# Patient Record
Sex: Female | Born: 1955 | Race: White | Hispanic: No | Smoking: Former smoker
Health system: Southern US, Community
[De-identification: ages and names within clinical notes are randomized; demographics above are authoritative.]

## PROBLEM LIST (undated history)

## (undated) DIAGNOSIS — I10 Essential (primary) hypertension: Secondary | ICD-10-CM

## (undated) DIAGNOSIS — T7840XA Allergy, unspecified, initial encounter: Secondary | ICD-10-CM

## (undated) DIAGNOSIS — F32A Depression, unspecified: Secondary | ICD-10-CM

## (undated) DIAGNOSIS — C439 Malignant melanoma of skin, unspecified: Secondary | ICD-10-CM

## (undated) HISTORY — PX: CHOLECYSTECTOMY: SHX55

## (undated) HISTORY — PX: HYSTEROTOMY: SHX1776

## (undated) HISTORY — PX: LAPAROSCOPIC GASTRIC SLEEVE RESECTION: SHX5895

## (undated) HISTORY — PX: CARPAL TUNNEL RELEASE: SHX101

## (undated) HISTORY — PX: MENISCUS REPAIR: SHX5179

---

## 2020-03-19 ENCOUNTER — Ambulatory Visit (HOSPITAL_COMMUNITY)
Admission: EM | Admit: 2020-03-19 | Discharge: 2020-03-19 | Disposition: A | Discharge status: Home / Self Care | Payer: Medicare Other | Attending: Emergency Medicine | Admitting: Emergency Medicine

## 2020-03-19 ENCOUNTER — Emergency Department (HOSPITAL_COMMUNITY): Payer: Medicare Other | Admitting: Anesthesiology

## 2020-03-19 ENCOUNTER — Encounter (HOSPITAL_COMMUNITY)
Admission: EM | Disposition: A | Discharge status: Home / Self Care | Payer: Self-pay | Source: Home / Self Care | Attending: Emergency Medicine

## 2020-03-19 ENCOUNTER — Other Ambulatory Visit: Payer: Self-pay

## 2020-03-19 ENCOUNTER — Encounter (HOSPITAL_COMMUNITY): Payer: Self-pay

## 2020-03-19 ENCOUNTER — Emergency Department (HOSPITAL_COMMUNITY): Payer: Medicare Other

## 2020-03-19 DIAGNOSIS — Z23 Encounter for immunization: Secondary | ICD-10-CM | POA: Insufficient documentation

## 2020-03-19 DIAGNOSIS — Z85820 Personal history of malignant melanoma of skin: Secondary | ICD-10-CM | POA: Diagnosis not present

## 2020-03-19 DIAGNOSIS — W19XXXA Unspecified fall, initial encounter: Secondary | ICD-10-CM | POA: Insufficient documentation

## 2020-03-19 DIAGNOSIS — S0181XA Laceration without foreign body of other part of head, initial encounter: Secondary | ICD-10-CM | POA: Insufficient documentation

## 2020-03-19 DIAGNOSIS — S0990XA Unspecified injury of head, initial encounter: Secondary | ICD-10-CM

## 2020-03-19 DIAGNOSIS — Y93K1 Activity, walking an animal: Secondary | ICD-10-CM | POA: Insufficient documentation

## 2020-03-19 DIAGNOSIS — Z87891 Personal history of nicotine dependence: Secondary | ICD-10-CM | POA: Diagnosis not present

## 2020-03-19 DIAGNOSIS — Z20822 Contact with and (suspected) exposure to covid-19: Secondary | ICD-10-CM | POA: Diagnosis not present

## 2020-03-19 HISTORY — DX: Allergy, unspecified, initial encounter: T78.40XA

## 2020-03-19 HISTORY — DX: Malignant melanoma of skin, unspecified: C43.9

## 2020-03-19 HISTORY — PX: LACERATION REPAIR: SHX5284

## 2020-03-19 HISTORY — DX: Essential (primary) hypertension: I10

## 2020-03-19 HISTORY — DX: Depression, unspecified: F32.A

## 2020-03-19 LAB — RESP PANEL BY RT-PCR (FLU A&B, COVID) ARPGX2
Influenza A by PCR: NEGATIVE
Influenza B by PCR: NEGATIVE
SARS Coronavirus 2 by RT PCR: NEGATIVE

## 2020-03-19 SURGERY — REPAIR, LACERATION, 2 OR MORE
Anesthesia: General | Site: Face

## 2020-03-19 MED ORDER — FENTANYL CITRATE (PF) 100 MCG/2ML IJ SOLN
25.0000 ug | INTRAMUSCULAR | Status: DC | PRN
  Administered 2020-03-19 (×2): 50 ug via INTRAVENOUS

## 2020-03-19 MED ORDER — PROPOFOL 10 MG/ML IV BOLUS
INTRAVENOUS | Status: AC
  Filled 2020-03-19: qty 20

## 2020-03-19 MED ORDER — PROPOFOL 10 MG/ML IV BOLUS
INTRAVENOUS | Status: DC | PRN
  Administered 2020-03-19: 180 mg via INTRAVENOUS

## 2020-03-19 MED ORDER — ACETAMINOPHEN 10 MG/ML IV SOLN: 1000.0000 mg | Freq: Once | INTRAVENOUS | Status: DC

## 2020-03-19 MED ORDER — DEXAMETHASONE SODIUM PHOSPHATE 10 MG/ML IJ SOLN
INTRAMUSCULAR | Status: DC | PRN
  Administered 2020-03-19: 10 mg via INTRAVENOUS

## 2020-03-19 MED ORDER — ACETAMINOPHEN 10 MG/ML IV SOLN
INTRAVENOUS | Status: AC
  Filled 2020-03-19: qty 100

## 2020-03-19 MED ORDER — TETANUS-DIPHTH-ACELL PERTUSSIS 5-2.5-18.5 LF-MCG/0.5 IM SUSY
0.5000 mL | PREFILLED_SYRINGE | Freq: Once | INTRAMUSCULAR | Status: AC
  Administered 2020-03-19: 0.5 mL via INTRAMUSCULAR
  Filled 2020-03-19: qty 0.5

## 2020-03-19 MED ORDER — ONDANSETRON HCL 4 MG/2ML IJ SOLN
INTRAMUSCULAR | Status: DC | PRN
  Administered 2020-03-19: 4 mg via INTRAVENOUS

## 2020-03-19 MED ORDER — KETOROLAC TROMETHAMINE 15 MG/ML IJ SOLN
15.0000 mg | Freq: Once | INTRAMUSCULAR | Status: AC
  Administered 2020-03-19: 15 mg via INTRAVENOUS

## 2020-03-19 MED ORDER — PHENYLEPHRINE HCL (PRESSORS) 10 MG/ML IV SOLN
INTRAVENOUS | Status: DC | PRN
  Administered 2020-03-19: 40 ug via INTRAVENOUS

## 2020-03-19 MED ORDER — FENTANYL CITRATE (PF) 100 MCG/2ML IJ SOLN
INTRAMUSCULAR | Status: AC
  Filled 2020-03-19: qty 2

## 2020-03-19 MED ORDER — LACTATED RINGERS IV SOLN: INTRAVENOUS | Status: DC | PRN

## 2020-03-19 MED ORDER — MIDAZOLAM HCL 5 MG/5ML IJ SOLN
INTRAMUSCULAR | Status: DC | PRN
  Administered 2020-03-19: 2 mg via INTRAVENOUS

## 2020-03-19 MED ORDER — SUCCINYLCHOLINE CHLORIDE 20 MG/ML IJ SOLN
INTRAMUSCULAR | Status: DC | PRN
  Administered 2020-03-19: 120 mg via INTRAVENOUS

## 2020-03-19 MED ORDER — BUPIVACAINE HCL (PF) 0.25 % IJ SOLN
INTRAMUSCULAR | Status: AC
  Filled 2020-03-19: qty 30

## 2020-03-19 MED ORDER — 0.9 % SODIUM CHLORIDE (POUR BTL) OPTIME
TOPICAL | Status: DC | PRN
  Administered 2020-03-19: 1000 mL

## 2020-03-19 MED ORDER — MORPHINE SULFATE (PF) 4 MG/ML IV SOLN
4.0000 mg | Freq: Once | INTRAVENOUS | Status: AC
  Administered 2020-03-19: 4 mg via INTRAVENOUS
  Filled 2020-03-19: qty 1

## 2020-03-19 MED ORDER — ARTIFICIAL TEARS OPHTHALMIC OINT
TOPICAL_OINTMENT | OPHTHALMIC | Status: DC | PRN
  Administered 2020-03-19: 1 via OPHTHALMIC

## 2020-03-19 MED ORDER — OXYCODONE HCL 5 MG PO TABS
ORAL_TABLET | ORAL | Status: AC
  Filled 2020-03-19: qty 1

## 2020-03-19 MED ORDER — OXYCODONE HCL 5 MG PO TABS
5.0000 mg | ORAL_TABLET | Freq: Once | ORAL | Status: AC
  Administered 2020-03-19: 5 mg via ORAL

## 2020-03-19 MED ORDER — OXYCODONE HCL 5 MG PO TABS: 5.0000 mg | ORAL_TABLET | ORAL | 0 refills | Status: AC | PRN

## 2020-03-19 MED ORDER — ONDANSETRON HCL 4 MG/2ML IJ SOLN: 4.0000 mg | Freq: Once | INTRAMUSCULAR | Status: DC | PRN

## 2020-03-19 MED ORDER — FENTANYL CITRATE (PF) 100 MCG/2ML IJ SOLN
INTRAMUSCULAR | Status: DC | PRN
  Administered 2020-03-19: 25 ug via INTRAVENOUS
  Administered 2020-03-19: 100 ug via INTRAVENOUS
  Administered 2020-03-19: 50 ug via INTRAVENOUS

## 2020-03-19 MED ORDER — DOXYCYCLINE HYCLATE 50 MG PO CAPS: 50.0000 mg | ORAL_CAPSULE | Freq: Two times a day (BID) | ORAL | 0 refills | Status: AC

## 2020-03-19 MED ORDER — BACITRACIN ZINC 500 UNIT/GM EX OINT
TOPICAL_OINTMENT | CUTANEOUS | Status: AC
  Filled 2020-03-19: qty 28.35

## 2020-03-19 MED ORDER — BUPIVACAINE HCL (PF) 0.25 % IJ SOLN
INTRAMUSCULAR | Status: DC | PRN
  Administered 2020-03-19: 5 mL

## 2020-03-19 MED ORDER — KETOROLAC TROMETHAMINE 15 MG/ML IJ SOLN
INTRAMUSCULAR | Status: AC
  Filled 2020-03-19: qty 1

## 2020-03-19 MED ORDER — BACITRACIN ZINC 500 UNIT/GM EX OINT
TOPICAL_OINTMENT | CUTANEOUS | Status: DC | PRN
  Administered 2020-03-19: 1 via TOPICAL

## 2020-03-19 MED ORDER — LIDOCAINE 2% (20 MG/ML) 5 ML SYRINGE
INTRAMUSCULAR | Status: DC | PRN
  Administered 2020-03-19: 100 mg via INTRAVENOUS

## 2020-03-19 MED ORDER — FENTANYL CITRATE (PF) 250 MCG/5ML IJ SOLN
INTRAMUSCULAR | Status: AC
  Filled 2020-03-19: qty 5

## 2020-03-19 MED ORDER — CEFAZOLIN SODIUM-DEXTROSE 2-4 GM/100ML-% IV SOLN
2.0000 g | INTRAVENOUS | Status: AC
  Administered 2020-03-19: 2 g via INTRAVENOUS
  Filled 2020-03-19 (×2): qty 100

## 2020-03-19 MED ORDER — MIDAZOLAM HCL 2 MG/2ML IJ SOLN
INTRAMUSCULAR | Status: AC
  Filled 2020-03-19: qty 2

## 2020-03-19 SURGICAL SUPPLY — 48 items
BAG DECANTER FOR FLEXI CONT (MISCELLANEOUS) IMPLANT
BLADE CLIPPER SURG (BLADE) IMPLANT
BNDG GAUZE ELAST 4 BULKY (GAUZE/BANDAGES/DRESSINGS) IMPLANT
CANISTER SUCT 3000ML PPV (MISCELLANEOUS) ×3 IMPLANT
CHLORAPREP W/TINT 26 (MISCELLANEOUS) IMPLANT
CNTNR URN SCR LID CUP LEK RST (MISCELLANEOUS) IMPLANT
CONT SPEC 4OZ STRL OR WHT (MISCELLANEOUS)
COVER SURGICAL LIGHT HANDLE (MISCELLANEOUS) ×3 IMPLANT
COVER WAND RF STERILE (DRAPES) ×3 IMPLANT
DRAPE HALF SHEET 40X57 (DRAPES) IMPLANT
DRAPE IMP U-DRAPE 54X76 (DRAPES) ×3 IMPLANT
DRAPE INCISE IOBAN 66X45 STRL (DRAPES) IMPLANT
DRAPE LAPAROTOMY 100X72 PEDS (DRAPES) IMPLANT
DRSG ADAPTIC 3X8 NADH LF (GAUZE/BANDAGES/DRESSINGS) IMPLANT
DRSG PAD ABDOMINAL 8X10 ST (GAUZE/BANDAGES/DRESSINGS) IMPLANT
DRSG VAC ATS LRG SENSATRAC (GAUZE/BANDAGES/DRESSINGS) IMPLANT
DRSG VAC ATS MED SENSATRAC (GAUZE/BANDAGES/DRESSINGS) IMPLANT
DRSG VAC ATS SM SENSATRAC (GAUZE/BANDAGES/DRESSINGS) IMPLANT
ELECT CAUTERY BLADE 6.4 (BLADE) ×3 IMPLANT
ELECT COATED BLADE 2.86 ST (ELECTRODE) ×3 IMPLANT
ELECT NEEDLE BLADE 2-5/6 (NEEDLE) ×3 IMPLANT
ELECT REM PT RETURN 9FT ADLT (ELECTROSURGICAL) ×3
ELECTRODE REM PT RTRN 9FT ADLT (ELECTROSURGICAL) ×1 IMPLANT
GAUZE 4X4 16PLY RFD (DISPOSABLE) ×3 IMPLANT
GAUZE SPONGE 4X4 12PLY STRL (GAUZE/BANDAGES/DRESSINGS) IMPLANT
GAUZE SPONGE 4X4 12PLY STRL LF (GAUZE/BANDAGES/DRESSINGS) ×3 IMPLANT
GLOVE BIO SURGEON STRL SZ 6 (GLOVE) ×3 IMPLANT
GLOVE SURG SS PI 6.0 STRL IVOR (GLOVE) ×3 IMPLANT
GOWN STRL REUS W/ TWL LRG LVL3 (GOWN DISPOSABLE) ×2 IMPLANT
GOWN STRL REUS W/TWL LRG LVL3 (GOWN DISPOSABLE) ×4
HANDPIECE INTERPULSE COAX TIP (DISPOSABLE)
KIT BASIN OR (CUSTOM PROCEDURE TRAY) ×3 IMPLANT
KIT TURNOVER KIT B (KITS) ×3 IMPLANT
NS IRRIG 1000ML POUR BTL (IV SOLUTION) ×3 IMPLANT
PACK GENERAL/GYN (CUSTOM PROCEDURE TRAY) ×3 IMPLANT
PACK UNIVERSAL I (CUSTOM PROCEDURE TRAY) IMPLANT
PAD ARMBOARD 7.5X6 YLW CONV (MISCELLANEOUS) ×6 IMPLANT
SET HNDPC FAN SPRY TIP SCT (DISPOSABLE) IMPLANT
SURGILUBE 2OZ TUBE FLIPTOP (MISCELLANEOUS) IMPLANT
SUT MNCRL AB 4-0 PS2 18 (SUTURE) ×9 IMPLANT
SUT PLAIN GUT FAST 5-0 (SUTURE) ×3 IMPLANT
SUT PROLENE 5 0 P 3 (SUTURE) ×6 IMPLANT
SWAB COLLECTION DEVICE MRSA (MISCELLANEOUS) IMPLANT
SWAB CULTURE ESWAB REG 1ML (MISCELLANEOUS) IMPLANT
TAPE PAPER 2X10 WHT MICROPORE (GAUZE/BANDAGES/DRESSINGS) ×3 IMPLANT
TOWEL GREEN STERILE (TOWEL DISPOSABLE) ×3 IMPLANT
TOWEL GREEN STERILE FF (TOWEL DISPOSABLE) ×3 IMPLANT
UNDERPAD 30X36 HEAVY ABSORB (UNDERPADS AND DIAPERS) ×3 IMPLANT

## 2020-03-19 NOTE — ED Provider Notes (Signed)
[facility]PART[facility] Provider Note   CSN: [id number] Arrival date & time: 03/19/20  1246     History Chief Complaint  Patient presents with  . Head Laceration  . Fall    Judy Richards is a 65 y.o. female.  The history is provided by the patient and medical records. No language interpreter was used.  Facial Injury Mechanism of injury:  Fall Location:  Forehead and nose Time since incident:  20 minutes Pain details:    Quality:  Dull   Severity:  Moderate   Timing:  Constant   Progression:  Unchanged Foreign body present:  Unable to specify Relieved by:  Nothing Worsened by:  Nothing Ineffective treatments:  None tried Associated symptoms: headaches   Associated symptoms: no altered mental status, no congestion, no difficulty breathing, no double vision, no loss of consciousness, no malocclusion, no nausea, no neck pain, no rhinorrhea, no trismus, no vomiting and no wheezing        Past Medical History:  Diagnosis Date  . Allergies    Requires shots and multiple medications for allergies  . Depression   . Hypertension   . Melanoma ([place])    L arm    There are no problems to display for this patient.      OB History   No obstetric history on file.     No family history on file.  Social History   Tobacco Use  . Smoking status: Former Smoker    Quit date: 2017    Years since quitting: 5.0  . Smokeless tobacco: Never Used  Vaping Use  . Vaping Use: Former  Substance Use Topics  . Alcohol use: Not Currently  . Drug use: Never    Home Medications Prior to Admission medications   Not on File    Allergies    Lisinopril and Lyrica [pregabalin]  Review of Systems   Review of Systems  Constitutional: Negative for chills, fatigue and fever.  HENT: Negative for congestion and rhinorrhea.   Eyes: Negative for double vision, photophobia, pain, redness and visual disturbance.  Respiratory: Negative for chest tightness,  shortness of breath and wheezing.   Gastrointestinal: Negative for constipation, diarrhea, nausea and vomiting.  Genitourinary: Negative for flank pain.  Musculoskeletal: Negative for back pain and neck pain.  Neurological: Positive for headaches. Negative for dizziness and loss of consciousness.  Psychiatric/Behavioral: Negative for agitation.  All other systems reviewed and are negative.   Physical Exam Updated Vital Signs BP (!) 158/80   Pulse 78   Resp (!) 22   Ht 5\' 5"  (1.651 m)   Wt 101.6 kg   SpO2 97%   BMI 37.28 kg/m   Physical Exam Vitals and nursing note reviewed.  Constitutional:      General: She is not in acute distress.    Appearance: She is well-developed and well-nourished. She is not ill-appearing, toxic-appearing or diaphoretic.  HENT:     Head: Laceration present.      Comments: Pupils are symmetric and with normal extraocular movements.  She is able to raise both eyebrows.  No crepitance.    Nose: No congestion or rhinorrhea.     Mouth/Throat:     Mouth: Mucous membranes are moist.     Pharynx: No oropharyngeal exudate or posterior oropharyngeal erythema.  Eyes:     Extraocular Movements: Extraocular movements intact.     Conjunctiva/sclera: Conjunctivae normal.     Pupils: Pupils are equal, round, and reactive to light.  Cardiovascular:     Rate and Rhythm: Normal rate and regular rhythm.     Heart sounds: No murmur heard.   Pulmonary:     Effort: Pulmonary effort is normal. No respiratory distress.     Breath sounds: Normal breath sounds. No wheezing, rhonchi or rales.  Chest:     Chest wall: No tenderness.  Abdominal:     General: Abdomen is flat.     Palpations: Abdomen is soft.     Tenderness: There is no abdominal tenderness. There is no right CVA tenderness, guarding or rebound.  Musculoskeletal:        General: Tenderness and signs of injury present. No edema.     Cervical back: Neck supple. No tenderness.  Skin:    General: Skin is  warm and dry.     Capillary Refill: Capillary refill takes less than 2 seconds.     Findings: No erythema.  Neurological:     General: No focal deficit present.     Mental Status: She is alert.     Sensory: No sensory deficit.     Motor: No weakness.  Psychiatric:        Mood and Affect: Mood and affect and mood normal.         ED Results / Procedures / Treatments   Labs (all labs ordered are listed, but only abnormal results are displayed) Labs Reviewed  RESP PANEL BY RT-PCR (FLU A&B, COVID) [id number]    EKG EKG Interpretation  Date/Time:  Sunday March 19 2020 13:13:25 EST Ventricular Rate:  80 PR Interval:    QRS Duration: 102 QT Interval:  427 QTC Calculation: 493 R Axis:   -20 Text Interpretation: Sinus rhythm Borderline left axis deviation Borderline prolonged QT interval No prior ECG for comparison. No STEMI Confirmed by Antony Blackbird [phone]) on 03/19/2020 2:05:35 PM   Radiology CT Head Wo Contrast  Result Date: 03/19/2020 CLINICAL DATA:  Head trauma EXAM: CT HEAD WITHOUT CONTRAST TECHNIQUE: Contiguous axial images were obtained from the base of the skull through the vertex without intravenous contrast. COMPARISON:  None. FINDINGS: Brain: Ventricles are normal in size and configuration. There is no mass, hemorrhage, edema or other evidence of acute parenchymal abnormality. No extra-axial hemorrhage. Vascular: Chronic calcified atherosclerotic changes of the large vessels at the skull base. No unexpected hyperdense vessel. Skull: No skull fracture or displacement. Sinuses/Orbits: Orbital and retro-orbital soft tissues are unremarkable. Other: Soft tissue defect/laceration overlying the frontal bones. No underlying fracture. IMPRESSION: 1. Soft tissue defect/laceration overlying the frontal bones. No underlying fracture. 2. No acute intracranial abnormality. No intracranial mass, hemorrhage or edema. Electronically Signed   By: Franki Cabot M.D.   On: 03/19/2020 14:31   CT  Cervical Spine Wo Contrast  Result Date: 03/19/2020 CLINICAL DATA:  Neck trauma EXAM: CT CERVICAL SPINE WITHOUT CONTRAST TECHNIQUE: Multidetector CT imaging of the cervical spine was performed without intravenous contrast. Multiplanar CT image reconstructions were also generated. COMPARISON:  None. FINDINGS: Alignment: Mild scoliosis. No evidence of acute vertebral body subluxation. Skull base and vertebrae: No fracture line or displaced fracture fragment is seen. Soft tissues and spinal canal: No prevertebral fluid or swelling. No visible canal hematoma. Disc levels: Degenerative changes involving the disc spaces from C3-4 through C7-T1, mild to moderate in degree with associated disc space narrowings and osseous spurring. Associated disc-osteophytic bulges at the C3-4 and C4-5 levels causing moderate central canal stenoses. Additional degenerative hypertrophy of the uncovertebral and facet joints causing moderate to severe LEFT  neural foramen stenoses at the C3-4 and C4-5 levels with possible associated nerve root impingement. Upper chest: No acute findings. Mild scarring/fibrosis in the lung apices. Other: Bilateral carotid atherosclerosis. IMPRESSION: 1. No acute findings. No fracture or acute subluxation within the cervical spine. 2. Degenerative changes of the cervical spine, as detailed above. 3. Bilateral carotid atherosclerosis. Electronically Signed   By: Franki Cabot M.D.   On: 03/19/2020 14:35   CT Maxillofacial Wo Contrast  Result Date: 03/19/2020 CLINICAL DATA:  Facial trauma EXAM: CT MAXILLOFACIAL WITHOUT CONTRAST TECHNIQUE: Multidetector CT imaging of the maxillofacial structures was performed. Multiplanar CT image reconstructions were also generated. COMPARISON:  None. FINDINGS: Osseous: Lower frontal bones are intact and normally aligned. No displaced nasal bone fracture. Osseous structures about the orbits appear intact and normally aligned bilaterally. Walls of the maxillary sinuses appear  intact and normally aligned. Bilateral zygomatic arches and pterygoid plates are intact. No mandible fracture or displacement seen. Orbits: Orbital globes appear normal in position and configuration. No retro-orbital hemorrhage or edema. Sinuses: Mucosal thickening/fluid within the LEFT maxillary sinus, ethmoid air cells and RIGHT sphenoid sinus, of uncertain chronicity. Soft tissues: Prominent soft tissue defect/laceration overlying the lower frontal bones. No underlying fracture. Limited intracranial: No significant or unexpected finding. IMPRESSION: 1. Prominent soft tissue defect/laceration overlying the lower frontal bones. No underlying fracture. 2. No facial bone fracture or dislocation seen. 3. Fairly extensive paranasal sinus disease, of uncertain chronicity. Electronically Signed   By: Franki Cabot M.D.   On: 03/19/2020 14:27    Procedures Procedures (including critical care time)  Medications Ordered in ED Medications  ceFAZolin (ANCEF) IVPB 2g/100 mL premix (has no administration in time range)  Tdap (BOOSTRIX) injection 0.5 mL (0.5 mLs Intramuscular Given 03/19/20 1426)  morphine 4 MG/ML injection 4 mg (4 mg Intravenous Given 03/19/20 1426)  morphine 4 MG/ML injection 4 mg (4 mg Intravenous Given 03/19/20 1543)    ED Course  I have reviewed the triage vital signs and the nursing notes.  Pertinent labs & imaging results that were available during my care of the patient were reviewed by me and considered in my medical decision making (see chart for details).    MDM Rules/Calculators/A&P                          Mykenna Service is a 65 y.o. female with a past medical history significant for hypertension, depression, and prior cholecystectomy who presents with head injury after a fall.  Patient reports that she was walking her dog and had to use the restroom and urinate and while trying to get to the bathroom, she had a mechanical fall tripping.  She reports she hit her forehead against a  wooden square pole.  She did not lose consciousness but had sudden onset of headache.  She also reports there was a "large chunk of my forehead" on the wooden pole.  She had a significant of bleeding and EMS was called.  EMS put a pressure dressing in place and she came to the emergency department in a c-collar.  On arrival, airway is intact and breath sounds are equal bilaterally.  She is complaining of only some headache and facial pain.  She denies nausea, vomiting, vision changes, diplopia, speech difficulties, or facial droop.  She is denying any other complaints in her neck, back, chest, or abdomen.  On exam, lungs are clear and chest is nontender.  Abdomen is nontender.  No tenderness in  the back.  No tenderness in the neck but she is in a c-collar.  There is still some bleeding under the dressing, it was removed and she has an extremely large complex laceration to her forehead that extends towards both her eyebrows and down her nose.  There is also a large piece of tissue missing.   CTs were obtained of the head, neck, and face and there was no evidence of acute fracture or intracranial hemorrhage.  No cervical spine injury seen but there was some degenerative changes we discussed.  Collar was removed.  Due to the extent of the facial trauma, facial trauma team was called.  They will come see the patient to decide where would be the most appropriate place for repair.  Tdap was updated as she did not know her last tetanus vaccination.  Otolaryngology came to see the patient and they will take her to the OR for management.  They ordered a COVID test and she will go to the OR for facial repair   Final Clinical Impression(s) / ED Diagnoses Final diagnoses:  Injury of head, initial encounter  Fall, initial encounter  Laceration of forehead, initial encounter    Clinical Impression: 1. Injury of head, initial encounter   2. Fall, initial encounter   3. Laceration of forehead, initial  encounter     Disposition: To OR with facial surgery  This note was prepared with assistance of Dragon voice recognition software. Occasional wrong-word or sound-a-like substitutions may have occurred due to the inherent limitations of voice recognition software.     Edenilson Austad, Gwenyth Allegra, MD 03/19/20 [id number]

## 2020-03-19 NOTE — Progress Notes (Addendum)
Pacu Discharge Note  Patient instuctions were given to family. Wound care, diet, pain, follow up care and how and whom to contact with concerns were discussed. Family aware someone needs to remain with patient overnight and concerns after receiving anesthesia and what to avoid and safety. Answered all questions and concerns.   Discharge paperwork has clear contact informations for surgeon and 24 hour RN line for concerns.  Discussed with pt and Son things to look for in head trauma that should be called into Surgeon or reasons to return to ED.   Discussed what concerns to look for including infection and signs/symptoms to look for.   IV was removed prior to discharge. Patient was brought to car with belongings.   Pt exits my care.

## 2020-03-19 NOTE — ED Triage Notes (Signed)
Pt arrived to ED via EMS w/ c/o head lac to central R side of forehead. EMS reports lac is like a star pattern w/ skull showing, but appears intact. Pt had mechanical fall while walking her dog down a steep hill, when she tripped and landed head first w/ impact to a wooden post w/ sharp edge. Pt A&Ox4, no LOC.  EMS IV 20g R hand, 276mcg fentanyl given over 40 min period of time  VS: BP 150/80, HR 90

## 2020-03-19 NOTE — Anesthesia Procedure Notes (Signed)
Procedure Name: Intubation Date/Time: 03/19/2020 6:57 PM Performed by: Suzy Bouchard, CRNA Pre-anesthesia Checklist: Patient identified, Emergency Drugs available, Suction available, Patient being monitored and Timeout performed Patient Re-evaluated:Patient Re-evaluated prior to induction Oxygen Delivery Method: Circle system utilized Preoxygenation: Pre-oxygenation with 100% oxygen Induction Type: IV induction and Rapid sequence Laryngoscope Size: Glidescope and 3 Grade View: Grade I Tube type: Oral Tube size: 7.0 mm Number of attempts: 1 Airway Equipment and Method: Video-laryngoscopy and Stylet Placement Confirmation: ETT inserted through vocal cords under direct vision,  positive ETCO2 and breath sounds checked- equal and bilateral Secured at: 23 cm Tube secured with: Tape Dental Injury: Teeth and Oropharynx as per pre-operative assessment  Comments: Elective glidescope- patient with "stiff neck" after fall

## 2020-03-19 NOTE — Anesthesia Preprocedure Evaluation (Addendum)
Anesthesia Evaluation  Patient identified by MRN, date of birth, ID band Patient awake  General Assessment Comment:facial laceration s/p fall while walking dog into wooden post  Reviewed: Allergy & Precautions, NPO status , Patient's Chart, lab work & pertinent test results  Airway Mallampati: II  TM Distance: >3 FB Neck ROM: Full    Dental  (+) Teeth Intact, Dental Advisory Given, Caps   Pulmonary shortness of breath, former smoker,    Pulmonary exam normal breath sounds clear to auscultation       Cardiovascular hypertension, Pt. 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  Reproductive/Obstetrics                            Anesthesia Physical Anesthesia Plan  ASA: II  Anesthesia Plan: General   Post-op Pain Management:    Induction: Intravenous, Rapid sequence and Cricoid pressure planned  PONV Risk Score and Plan: 3 and Ondansetron, Dexamethasone and Midazolam  Airway Management Planned: Oral ETT  Additional Equipment:   Intra-op Plan:   Post-operative Plan: Extubation in OR  Informed Consent: I have reviewed the patients History and Physical, chart, labs and discussed the procedure including the risks, benefits and alternatives for the proposed anesthesia with the patient or authorized representative who has indicated his/her understanding and acceptance.     Dental advisory given  Plan Discussed with: CRNA  Anesthesia Plan  Comments:         Anesthesia Quick Evaluation

## 2020-03-19 NOTE — Anesthesia Postprocedure Evaluation (Signed)
Anesthesia Post Note  Patient: Judy Richards  Procedure(s) Performed: REPAIR MULTIPLE LACERATIONS (N/A Face)     Patient location during evaluation: PACU Anesthesia Type: General Level of consciousness: awake and alert Pain management: pain level controlled Vital Signs Assessment: post-procedure vital signs reviewed and stable Respiratory status: spontaneous breathing, nonlabored ventilation, respiratory function stable and patient connected to nasal cannula oxygen Cardiovascular status: blood pressure returned to baseline and stable Postop Assessment: no apparent nausea or vomiting Anesthetic complications: no   No complications documented.  Last Vitals:  Vitals:   03/19/20 2015 03/19/20 2120  BP:  (!) (P) 149/73  Pulse: 92   Resp: 17   Temp:  (P) 36.8 C  SpO2: 98%     Last Pain:  Vitals:   03/19/20 2120  PainSc: (P) 6                  Tiajuana Amass

## 2020-03-19 NOTE — Discharge Instructions (Signed)
Laceration Care, Adult A laceration is a cut that may go through all layers of the skin. The cut may also go into the tissue that is right under the skin. Some cuts heal on their own. Others need to be closed with stitches (sutures), staples, skin adhesive strips, or skin glue. Taking care of your injury lowers your risk of infection, helps your injury to heal better, and may prevent scarring. Supplies needed:  Soap.  Water.  Hand sanitizer.  Bandage (dressing).  Antibiotic ointment.  Clean towel. How to take care of your cut Wash your hands with soap and water before touching your wound or changing your bandage. If soap and water are not available, use hand sanitizer. If your doctor used stitches or staples:  Keep the wound clean and dry.  If you were given a bandage, change it at least once a day as told by your doctor. You should also change it if it gets wet or dirty.  Keep the wound completely dry for the first 24 hours, or as told by your doctor. After that, you may take a shower or a bath. Do not get the wound soaked in water until after the stitches or staples have been removed.  Clean the wound once a day, or as told by your doctor: ? Wash the wound with soap and water. ? Rinse the wound with water to remove all soap. ? Pat the wound dry with a clean towel. Do not rub the wound.  After you clean the wound, put a thin layer of antibiotic ointment on it as told by your doctor. This ointment: ? Helps to prevent infection. ? Keeps the bandage from sticking to the wound.  Have your stitches or staples removed as told by your doctor. If your doctor used skin adhesive strips:  Keep the wound clean and dry.  If you were given a bandage, you should change it at least once a day as told by your doctor. You should also change it if it gets wet or dirty.  Do not get the skin adhesive strips wet. You can take a shower or a bath, but keep the wound dry.  If the wound gets wet,  pat it dry with a clean towel. Do not rub the wound.  Skin adhesive strips fall off on their own. You can trim the strips as the wound heals. Do not remove any strips that are still stuck to the wound. They will fall off after a while. If your doctor used skin glue:  Try to keep your wound dry, but you may briefly wet it in the shower or bath. Do not soak the wound in water, such as by swimming.  After you take a shower or a bath, gently pat the wound dry with a clean towel. Do not rub the wound.  Do not do any activities that will make you really sweaty until the skin glue has fallen off on its own.  Do not apply liquid, cream, or ointment medicine to your wound while the skin glue is still on.  If you were given a bandage, you should change it at least once a day or as told by your doctor. You should also change it if it gets dirty or wet.  If a bandage is placed over the wound, do not let the tape touch the skin glue.  Do not pick at the glue. The skin glue usually stays on for 5-10 days. Then, it falls off the skin. General   instructions  Take over-the-counter and prescription medicines only as told by your doctor.  If you were given antibiotic medicine or ointment, take or apply it as told by your doctor. Do not stop using it even if your condition improves.  Do not scratch or pick at the wound.  Check your wound every day for signs of infection. Watch for: ? Redness, swelling, or pain. ? Fluid, blood, or pus.  Raise (elevate) the injured area above the level of your heart while you are sitting or lying down.  If directed, put ice on the affected area: ? Put ice in a plastic bag. ? Place a towel between your skin and the bag. ? Leave the ice on for 20 minutes, 2-3 times a day.  Prevent scarring by covering your wound with sunscreen of at least 30 SPF whenever you are outside after your wound has healed.  Keep all follow-up visits as told by your doctor. This is important.    Get help if:  You got a tetanus shot and you have any of these problems at the injection site: ? Swelling. ? Very bad pain. ? Redness. ? Bleeding.  You have a fever.  A wound that was closed breaks open.  You notice a bad smell coming from your wound or your bandage.  You notice something coming out of the wound, such as wood or glass.  Medicine does not relieve your pain.  You have more redness, swelling, or pain at the site of your wound.  You have fluid, blood, or pus coming from your wound.  You notice a change in the color of your skin near your wound.  You need to change the bandage often because fluid, blood, or pus is coming from the wound.  You start to have a new rash.  You start to have numbness around the wound. Get help right away if:  You have very bad swelling around the wound.  Your pain suddenly gets worse and is very bad.  You notice painful lumps near the wound or anywhere on your body.  You have a red streak going away from your wound.  The wound is on your hand or foot, and: ? You cannot move a finger or toe. ? Your fingers or toes look pale or bluish. Summary  A laceration is a cut that may go through all layers of the skin. The cut may also go into the tissue right under the skin.  Some cuts heal on their own. Others need to be closed with stitches, staples, skin adhesive strips, or skin glue.  Follow your doctor's instructions for caring for your cut. Proper care of a cut lowers the risk of infection, helps the cut heal better, and prevents scarring. This information is not intended to replace advice given to you by your health care provider. Make sure you discuss any questions you have with your health care provider. Document Revised: 04/25/2017 Document Reviewed: 03/17/2017 Elsevier Patient Education  2021 [place]. PATIENT INSTRUCTIONS POST-ANESTHESIA  IMMEDIATELY FOLLOWING SURGERY:  Do not drive or operate machinery for the  first twenty four hours after surgery.  Do not make any important decisions for twenty four hours after surgery or while taking narcotic pain medications or sedatives.  If you develop intractable nausea and vomiting or a severe headache please notify your doctor immediately.  FOLLOW-UP:  Please make an appointment with your surgeon as instructed. You do not need to follow up with anesthesia unless specifically instructed to do  so.  WOUND CARE INSTRUCTIONS (if applicable):  Keep a dry clean dressing on the anesthesia/puncture wound site if there is drainage.  Once the wound has quit draining you may leave it open to air.  Generally you should leave the bandage intact for twenty four hours unless there is drainage.  If the epidural site drains for more than 36-48 hours please call the anesthesia department.  QUESTIONS?:  Please feel free to call your physician or the hospital operator if you have any questions, and they will be happy to assist you.

## 2020-03-19 NOTE — Op Note (Addendum)
Operative Note   DATE OF OPERATION: 1.9.22  LOCATION: [facility]-outpatient  SURGICAL DIVISION: Plastic Surgery  PREOPERATIVE DIAGNOSES:  1. Open wound forehead with stellate laceration  POSTOPERATIVE DIAGNOSES:  same  PROCEDURE:  Complex repair forehead total 13 cm  SURGEON: Irene Limbo MD MBA  ASSISTANT: none  ANESTHESIA:  General.   EBL: 50 ml  COMPLICATIONS: None immediate.   INDICATIONS FOR PROCEDURE:  The patient, Judy Richards, is a 65 y.o. female born on January 13, 1956, is here for debridement and closure wound forehead following fall onto post.    FINDINGS: Stellate laceration forehead with loss soft tissue mid forehead. No exposed bone devoid if periosteum. Laceration traverses both brows. Avulsion flap noted with laceration of supratrochlear and supraorbital neurovascular bundles approximately 1 cm cephalad from brow level. Following closure remaining open wound forehead 1.2 x 1.8 cm.  DESCRIPTION OF PROCEDURE:  The patient's operative site was marked with the patient in the preoperative area. The patient was taken to the operating room. SCDs were placed and IV antibiotics were given. The patient's operative site was prepped and draped in a sterile fashion. A time out was performed and all information was confirmed to be correct. Local anesthetic infiltrated. Wound irrigated and hemostasis obtained. Sharp excision of skin margins completed. Layered closure completed with 4-0 monocryl in dermis taking care to align brows. Surrounding area of loss of skin and soft tissue, avulsed flaps sutured from dermis to frontalis muscle with interrupted 4-0 moncryl. Skin closure completed with short running 5-0 prolene and 5-0 plain gut. Xeroform placed in open wound. Antibiotic ointment and dry dressing appled.  The patient was allowed to wake from anesthesia, extubated and taken to the recovery room in satisfactory condition.   SPECIMENS: none  DRAINS: none

## 2020-03-19 NOTE — OR PostOp (Incomplete)
PACU TO INPATIENT HANDOFF REPORT  Name/Age/Gender Judy Richards 65 y.o. female  Code Status Advance Directive Documentation   Flowsheet Row Most Recent Value  Type of Advance Directive Living will, Healthcare Power of Attorney  Pre-existing out of facility DNR order (yellow form or pink MOST form) -  "MOST" Form in Place? -      Home/SNF/Other {Discharge Destination:18313::"Home"}  Chief Complaint Injury of head, initial encounter [S09.90XA] Fall, initial encounter [W19.XXXA] Laceration of forehead, initial encounter [S01.81XA]  Level of Care/Admitting Diagnosis ED Disposition    ED Disposition Condition Comment   Admit  The patient appears reasonably stabilized for admission considering the current resources, flow, and capabilities available in the ED at this time, and I doubt any other [facility] requiring further screening and/or treatment in the ED prior to admission is  present.  To OR       Medical History Past Medical History:  Diagnosis Date  . Allergies    Requires shots and multiple medications for allergies  . Depression   . Hypertension   . Melanoma ([place])    L arm    Allergies Allergies  Allergen Reactions  . Lisinopril Cough  . Lyrica [Pregabalin] Other (See Comments)    Gets really depressed    IV Location/Drains/Wounds Patient Lines/Drains/Airways Status    Active Line/Drains/Airways    Name Placement date Placement time Site Days   Peripheral IV 03/19/20 Right Hand 03/19/20  1255  Hand  less than 1   Incision (Closed) 03/19/20 Face Other (Comment) 03/19/20  2001  - less than 1          Labs/Imaging Results for orders placed or performed during the hospital encounter of 03/19/20 (from the past 48 hour(s))  Resp Panel by RT-PCR (Flu A&B, Covid) Nasopharyngeal Swab     Status: None   Collection Time: 03/19/20  3:42 PM   Specimen: Nasopharyngeal Swab; Nasopharyngeal(NP) swabs in vial transport medium  Result Value Ref Range   SARS  Coronavirus 2 by RT PCR NEGATIVE NEGATIVE    Comment: (NOTE) SARS-CoV-2 target nucleic acids are NOT DETECTED.  The SARS-CoV-2 RNA is generally detectable in upper respiratory specimens during the acute phase of infection. The lowest concentration of SARS-CoV-2 viral copies this assay can detect is 138 copies/mL. A negative result does not preclude SARS-Cov-2 infection and should not be used as the sole basis for treatment or other patient management decisions. A negative result may occur with  improper specimen collection/handling, submission of specimen other than nasopharyngeal swab, presence of viral mutation(s) within the areas targeted by this assay, and inadequate number of viral copies(<138 copies/mL). A negative result must be combined with clinical observations, patient history, and epidemiological information. The expected result is Negative.  Fact Sheet for Patients:  EntrepreneurPulse.com.au  Fact Sheet for Healthcare Providers:  IncredibleEmployment.be  This test is no t yet approved or cleared by the [place] FDA and  has been authorized for detection and/or diagnosis of SARS-CoV-2 by FDA under an Emergency Use Authorization (EUA). This EUA will remain  in effect (meaning this test can be used) for the duration of the COVID-19 declaration under Section 564(b)(1) of the Act, 21 U.S.C.section 360bbb-3(b)(1), unless the authorization is terminated  or revoked sooner.       Influenza A by PCR NEGATIVE NEGATIVE   Influenza B by PCR NEGATIVE NEGATIVE    Comment: (NOTE) The Xpert Xpress SARS-CoV-2/FLU/RSV plus assay is intended as an aid in the diagnosis of influenza from Nasopharyngeal swab specimens  and should not be used as a sole basis for treatment. Nasal washings and aspirates are unacceptable for Xpert Xpress SARS-CoV-2/FLU/RSV testing.  Fact Sheet for Patients: EntrepreneurPulse.com.au  Fact Sheet  for Healthcare Providers: IncredibleEmployment.be  This test is not yet approved or cleared by the [place] FDA and has been authorized for detection and/or diagnosis of SARS-CoV-2 by FDA under an Emergency Use Authorization (EUA). This EUA will remain in effect (meaning this test can be used) for the duration of the COVID-19 declaration under Section 564(b)(1) of the Act, 21 U.S.C. section 360bbb-3(b)(1), unless the authorization is terminated or revoked.  Performed at [facility], [place] [address] [place], [place] [address]    CT Head Wo Contrast  Result Date: 03/19/2020 CLINICAL DATA:  Head trauma EXAM: CT HEAD WITHOUT CONTRAST TECHNIQUE: Contiguous axial images were obtained from the base of the skull through the vertex without intravenous contrast. COMPARISON:  None. FINDINGS: Brain: Ventricles are normal in size and configuration. There is no mass, hemorrhage, edema or other evidence of acute parenchymal abnormality. No extra-axial hemorrhage. Vascular: Chronic calcified atherosclerotic changes of the large vessels at the skull base. No unexpected hyperdense vessel. Skull: No skull fracture or displacement. Sinuses/Orbits: Orbital and retro-orbital soft tissues are unremarkable. Other: Soft tissue defect/laceration overlying the frontal bones. No underlying fracture. IMPRESSION: 1. Soft tissue defect/laceration overlying the frontal bones. No underlying fracture. 2. No acute intracranial abnormality. No intracranial mass, hemorrhage or edema. Electronically Signed   By: Franki Cabot M.D.   On: 03/19/2020 14:31   CT Cervical Spine Wo Contrast  Result Date: 03/19/2020 CLINICAL DATA:  Neck trauma EXAM: CT CERVICAL SPINE WITHOUT CONTRAST TECHNIQUE: Multidetector CT imaging of the cervical spine was performed without intravenous contrast. Multiplanar CT image reconstructions were also generated. COMPARISON:  None. FINDINGS: Alignment: Mild scoliosis. No evidence of  acute vertebral body subluxation. Skull base and vertebrae: No fracture line or displaced fracture fragment is seen. Soft tissues and spinal canal: No prevertebral fluid or swelling. No visible canal hematoma. Disc levels: Degenerative changes involving the disc spaces from C3-4 through C7-T1, mild to moderate in degree with associated disc space narrowings and osseous spurring. Associated disc-osteophytic bulges at the C3-4 and C4-5 levels causing moderate central canal stenoses. Additional degenerative hypertrophy of the uncovertebral and facet joints causing moderate to severe LEFT neural foramen stenoses at the C3-4 and C4-5 levels with possible associated nerve root impingement. Upper chest: No acute findings. Mild scarring/fibrosis in the lung apices. Other: Bilateral carotid atherosclerosis. IMPRESSION: 1. No acute findings. No fracture or acute subluxation within the cervical spine. 2. Degenerative changes of the cervical spine, as detailed above. 3. Bilateral carotid atherosclerosis. Electronically Signed   By: Franki Cabot M.D.   On: 03/19/2020 14:35   CT Maxillofacial Wo Contrast  Result Date: 03/19/2020 CLINICAL DATA:  Facial trauma EXAM: CT MAXILLOFACIAL WITHOUT CONTRAST TECHNIQUE: Multidetector CT imaging of the maxillofacial structures was performed. Multiplanar CT image reconstructions were also generated. COMPARISON:  None. FINDINGS: Osseous: Lower frontal bones are intact and normally aligned. No displaced nasal bone fracture. Osseous structures about the orbits appear intact and normally aligned bilaterally. Walls of the maxillary sinuses appear intact and normally aligned. Bilateral zygomatic arches and pterygoid plates are intact. No mandible fracture or displacement seen. Orbits: Orbital globes appear normal in position and configuration. No retro-orbital hemorrhage or edema. Sinuses: Mucosal thickening/fluid within the LEFT maxillary sinus, ethmoid air cells and RIGHT sphenoid sinus, of  uncertain chronicity. Soft tissues: Prominent soft  tissue defect/laceration overlying the lower frontal bones. No underlying fracture. Limited intracranial: No significant or unexpected finding. IMPRESSION: 1. Prominent soft tissue defect/laceration overlying the lower frontal bones. No underlying fracture. 2. No facial bone fracture or dislocation seen. 3. Fairly extensive paranasal sinus disease, of uncertain chronicity. Electronically Signed   By: Franki Cabot M.D.   On: 03/19/2020 14:27    Pending Labs   Vitals/Pain Today's Vitals   03/19/20 1815 03/19/20 2010 03/19/20 2015 03/19/20 2025  BP:  (!) 166/85    Pulse:  93 92   Resp:  17 17   Temp:  98.6 F (37 C)    SpO2:  97% 98%   Weight:      Height:      PainSc: 8  10-Worst pain ever 10-Worst pain ever 10-Worst pain ever    Isolation Precautions @ISOLATION @  Administered Medications Periop Administered Meds from 03/18/2020 2107 to 03/19/2020 2107      Date/Time Order Dose Route Action Action by Comments    03/19/2020 1821 0.9 % irrigation (POUR BTL) 1,000 mL Irrigation Given Irene Limbo, MD expires-10/2022    03/19/2020 2040 acetaminophen (OFIRMEV) 10 MG/ML IV    New Bag/Given Scherrie November, RN     03/19/2020 1858 artificial tears (LACRILUBE) ophthalmic ointment 1 application Both Eyes Given Suzy Bouchard, CRNA     03/19/2020 1936 bacitracin ointment 1 application Topical Given Irene Limbo, MD expires-06/2021    03/19/2020 1825 bupivacaine (PF) (MARCAINE) 0.25 % injection 5 mL Infiltration Given Irene Limbo, MD expires-09/2021    03/19/2020 1858 ceFAZolin (ANCEF) IVPB 2g/100 mL premix 2 g Intravenous Given Suzy Bouchard, CRNA     03/19/2020 1851 ceFAZolin (ANCEF) IVPB 2g/100 mL premix   Intravenous MAR Hold Transfer Provider, Automatic     03/19/2020 1900 dexamethasone (DECADRON) injection 10 mg Intravenous Given Suzy Bouchard, CRNA     03/19/2020 1946 fentaNYL (SUBLIMAZE) injection 25 mcg Intravenous  Given Suzy Bouchard, CRNA     03/19/2020 1912 fentaNYL (SUBLIMAZE) injection 50 mcg Intravenous Given Suzy Bouchard, CRNA     03/19/2020 1855 fentaNYL (SUBLIMAZE) injection 100 mcg Intravenous Given Suzy Bouchard, CRNA     03/19/2020 2027 fentaNYL (SUBLIMAZE) injection 25-50 mcg 50 mcg Intravenous Given Scherrie November, RN     03/19/2020 2019 fentaNYL (SUBLIMAZE) injection 25-50 mcg 50 mcg Intravenous Given Scherrie November, RN     03/19/2020 2050 ketorolac (TORADOL) 15 MG/ML injection 15 mg 15 mg Intravenous Given Scherrie November, RN     03/19/2020 1945 lactated ringers infusion   Intravenous Anesthesia Volume Adjustment Suzy Bouchard, CRNA     03/19/2020 1852 lactated ringers infusion   Intravenous New Bag/Given Suzy Bouchard, CRNA     03/19/2020 1855 lidocaine 2% (20 mg/mL) 5 mL syringe 100 mg Intravenous Given Suzy Bouchard, CRNA     03/19/2020 1853 midazolam (VERSED) 5 MG/5ML injection 2 mg Intravenous Given Suzy Bouchard, CRNA     03/19/2020 1426 morphine 4 MG/ML injection 4 mg 4 mg Intravenous Given Lewis Moccasin, RN     03/19/2020 1543 morphine 4 MG/ML injection 4 mg 4 mg Intravenous Given Lewis Moccasin, RN     03/19/2020 1923 ondansetron (ZOFRAN) injection 4 mg Intravenous Given Suzy Bouchard, CRNA     03/19/2020 2050 oxyCODONE (Oxy IR/ROXICODONE) immediate release tablet 5 mg 5 mg Oral Given Scherrie November, RN     03/19/2020 1921 phenylephrine (NEO-SYNEPHRINE) injection 40 mcg Intravenous Given  Suzy Bouchard, CRNA     03/19/2020 1855 propofol (DIPRIVAN) 10 mg/mL bolus/IV push 180 mg Intravenous Given Suzy Bouchard, CRNA     03/19/2020 1855 succinylcholine (ANECTINE) injection 120 mg Intravenous Given Suzy Bouchard, CRNA     03/19/2020 1426 Tdap (BOOSTRIX) injection 0.5 mL 0.5 mL Intramuscular Given Lewis Moccasin, RN       Mobility {Mobility:20148}

## 2020-03-19 NOTE — Transfer of Care (Addendum)
Immediate Anesthesia Transfer of Care Note  Patient: Judy Richards  Procedure(s) Performed: REPAIR MULTIPLE LACERATIONS (N/A Face)  Patient Location: PACU  Anesthesia Type:General  Level of Consciousness: awake and alert   Airway & Oxygen Therapy: Patient Spontanous Breathing and Patient connected to nasal cannula oxygen  Post-op Assessment: Report given to RN and Post -op Vital signs reviewed and stable  Post vital signs: Reviewed and stable  Last Vitals:  Vitals Value Taken Time  BP    Temp    Pulse 92 03/19/20 2008  Resp 22 03/19/20 2008  SpO2 99 % 03/19/20 2008  Vitals shown include unvalidated device data.  Last Pain:  Vitals:   03/19/20 1815  PainSc: 8          Complications: No complications documented.  Patient states vision same as before.

## 2020-03-19 NOTE — Consult Note (Signed)
Reason for Consult: facial laceration Referring Physician: Loletha Grayer Tegeler MD Location: [facility]-outpatient Date: 1.9.2022   Judy Richards is a 65 y.o. female   HPI: Plastic surgery consulted for complex laceration following fall while walking dog onto wooden post. No LOC. Td ordered in ED. 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 Per patient was told she had MI during immunotherapy. Patient denies CP, SOB in daily activities, followed by [facility].  Past Medical History:  Diagnosis Date  . Allergies    Requires shots and multiple medications for allergies  . Depression   . Hypertension   . Melanoma ([place])    L arm       reports that she quit smoking about 5 years ago. She has never used smokeless tobacco. She reports previous alcohol use. She reports that she does not use drugs.  Allergies  Allergen Reactions  . Lisinopril Cough  . Lyrica [Pregabalin] Other (See Comments)    Gets really depressed     Medications: I have reviewed the patient's current medications   CT Head Wo Contrast  Result Date: 03/19/2020 CLINICAL DATA:  Head trauma EXAM: CT HEAD WITHOUT CONTRAST TECHNIQUE: Contiguous axial images were obtained from the base of the skull through the vertex without intravenous contrast. COMPARISON:  None. FINDINGS: Brain: Ventricles are normal in size and configuration. There is no mass, hemorrhage, edema or other evidence of acute parenchymal abnormality. No extra-axial hemorrhage. Vascular: Chronic calcified atherosclerotic changes of the large vessels at the skull base. No unexpected hyperdense vessel. Skull: No skull fracture or displacement. Sinuses/Orbits: Orbital and retro-orbital soft tissues are unremarkable. Other: Soft tissue defect/laceration overlying the frontal bones. No underlying fracture. IMPRESSION: 1. Soft tissue defect/laceration overlying the frontal bones. No underlying fracture. 2. No acute intracranial abnormality.  No intracranial mass, hemorrhage or edema. Electronically Signed   By: Franki Cabot M.D.   On: 03/19/2020 14:31   CT Cervical Spine Wo Contrast  Result Date: 03/19/2020 CLINICAL DATA:  Neck trauma EXAM: CT CERVICAL SPINE WITHOUT CONTRAST TECHNIQUE: Multidetector CT imaging of the cervical spine was performed without intravenous contrast. Multiplanar CT image reconstructions were also generated. COMPARISON:  None. FINDINGS: Alignment: Mild scoliosis. No evidence of acute vertebral body subluxation. Skull base and vertebrae: No fracture line or displaced fracture fragment is seen. Soft tissues and spinal canal: No prevertebral fluid or swelling. No visible canal hematoma. Disc levels: Degenerative changes involving the disc spaces from C3-4 through C7-T1, mild to moderate in degree with associated disc space narrowings and osseous spurring. Associated disc-osteophytic bulges at the C3-4 and C4-5 levels causing moderate central canal stenoses. Additional degenerative hypertrophy of the uncovertebral and facet joints causing moderate to severe LEFT neural foramen stenoses at the C3-4 and C4-5 levels with possible associated nerve root impingement. Upper chest: No acute findings. Mild scarring/fibrosis in the lung apices. Other: Bilateral carotid atherosclerosis. IMPRESSION: 1. No acute findings. No fracture or acute subluxation within the cervical spine. 2. Degenerative changes of the cervical spine, as detailed above. 3. Bilateral carotid atherosclerosis. Electronically Signed   By: Franki Cabot M.D.   On: 03/19/2020 14:35   CT Maxillofacial Wo Contrast  Result Date: 03/19/2020 CLINICAL DATA:  Facial trauma EXAM: CT MAXILLOFACIAL WITHOUT CONTRAST TECHNIQUE: Multidetector CT imaging of the maxillofacial structures was performed. Multiplanar CT image reconstructions were also generated. COMPARISON:  None. FINDINGS: Osseous: Lower frontal bones are intact and normally aligned. No displaced nasal bone fracture.  Osseous structures about the orbits appear intact  and normally aligned bilaterally. Walls of the maxillary sinuses appear intact and normally aligned. Bilateral zygomatic arches and pterygoid plates are intact. No mandible fracture or displacement seen. Orbits: Orbital globes appear normal in position and configuration. No retro-orbital hemorrhage or edema. Sinuses: Mucosal thickening/fluid within the LEFT maxillary sinus, ethmoid air cells and RIGHT sphenoid sinus, of uncertain chronicity. Soft tissues: Prominent soft tissue defect/laceration overlying the lower frontal bones. No underlying fracture. Limited intracranial: No significant or unexpected finding. IMPRESSION: 1. Prominent soft tissue defect/laceration overlying the lower frontal bones. No underlying fracture. 2. No facial bone fracture or dislocation seen. 3. Fairly extensive paranasal sinus disease, of uncertain chronicity. Electronically Signed   By: Franki Cabot M.D.   On: 03/19/2020 14:27    BP (!) 158/80   Pulse 78   Resp (!) 22   Ht 5\' 5"  (1.651 m)   Wt 101.6 kg   SpO2 97%   BMI 37.28 kg/m    Physical Exam: CV: normal heart sounds Pulm: clear to ausculation HEENT: stellate laceration over forehead and onto glabella full thickness to periosteum, approximately 2 x 2 cm area likely absent skin and soft tissue  Assessment/Plan: To OR today for irrigation debridement wounds and repair lacerations as able. Discussed with patient may have missing skin and will likely need to do wound care to heal any open areas secondarily.  Irene Limbo, MD [facility]

## 2020-03-20 ENCOUNTER — Encounter (HOSPITAL_COMMUNITY): Payer: Self-pay | Admitting: Plastic Surgery

## 2022-06-21 IMAGING — CT CT MAXILLOFACIAL W/O CM
3 of 6 series · 15 of 47 positions shown, 18 images · non-contrast
Comparison: None.

CLINICAL DATA: Facial trauma

EXAM:
CT MAXILLOFACIAL WITHOUT CONTRAST
TECHNIQUE: Multidetector CT imaging of the maxillofacial structures was
performed. Multiplanar CT image reconstructions were also generated.

[Series 3: maxilllofacial 2.0 hr40 3 (person_name) · axial · 0.43mm/px · z∈[-209,-65]mm · 9 of 85 slices shown, 12 images]
[im 7/85  brain]
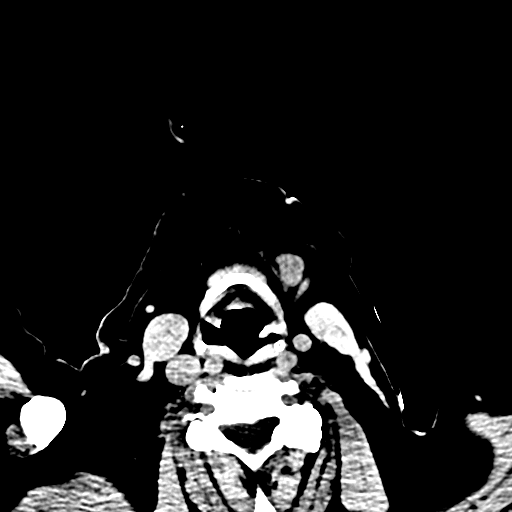
[im 7/85  bone]
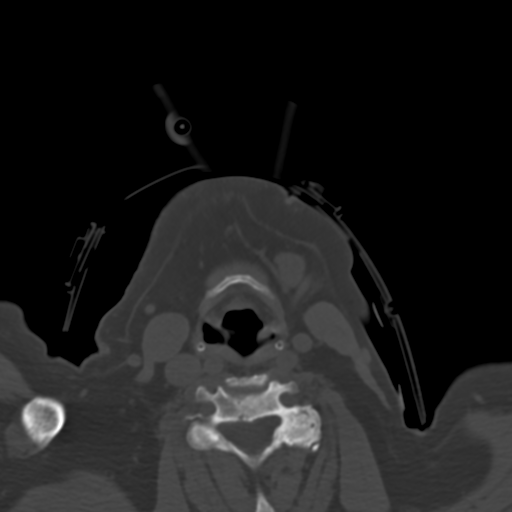
[im 19/85  bone]
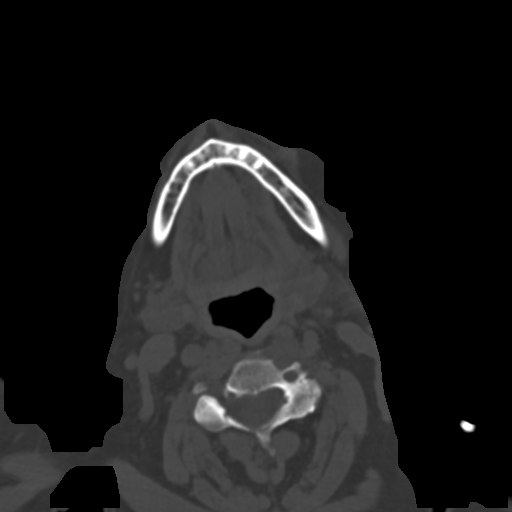
[im 25/85  bone]
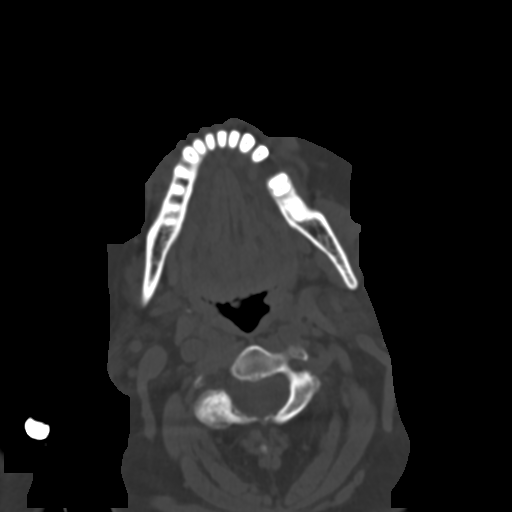
[im 37/85  bone]
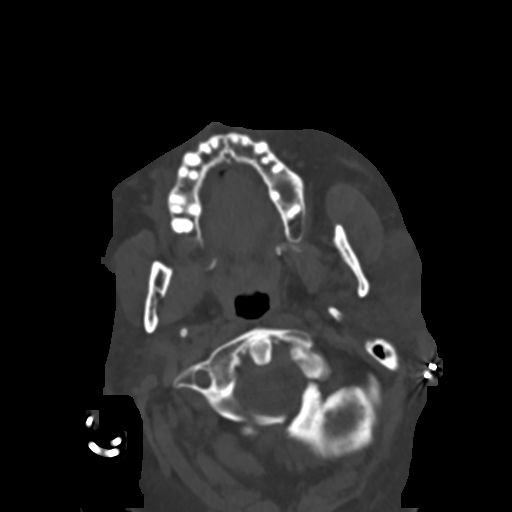
[im 43/85  brain]
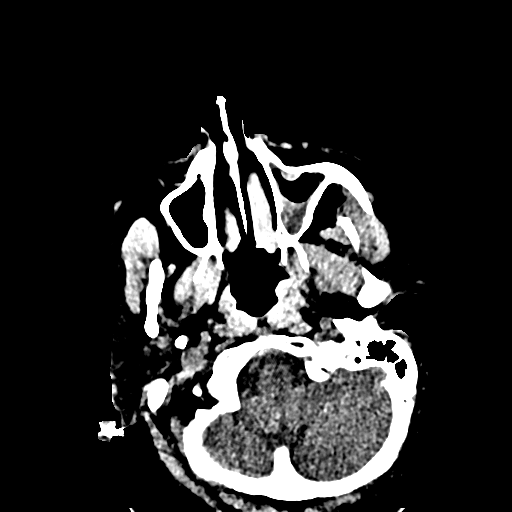
[im 43/85  bone]
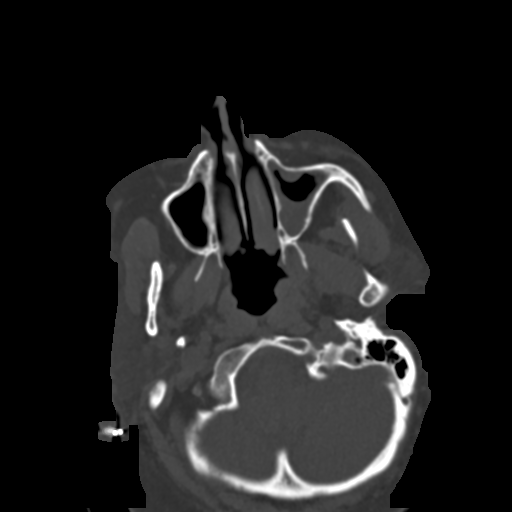
[im 49/85  bone]
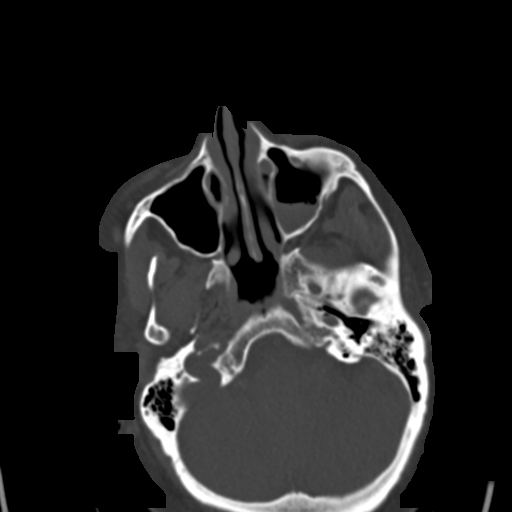
[im 61/85  bone]
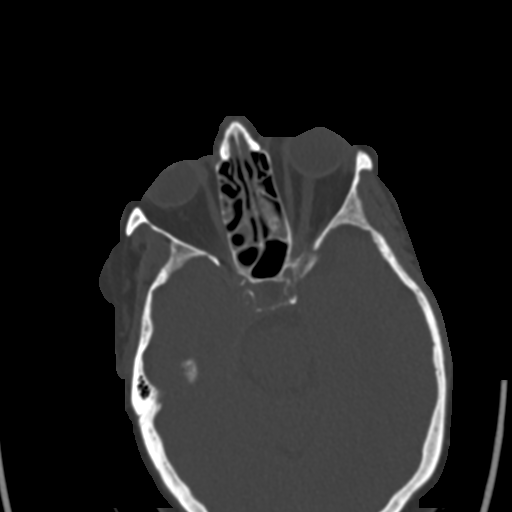
[im 67/85  bone]
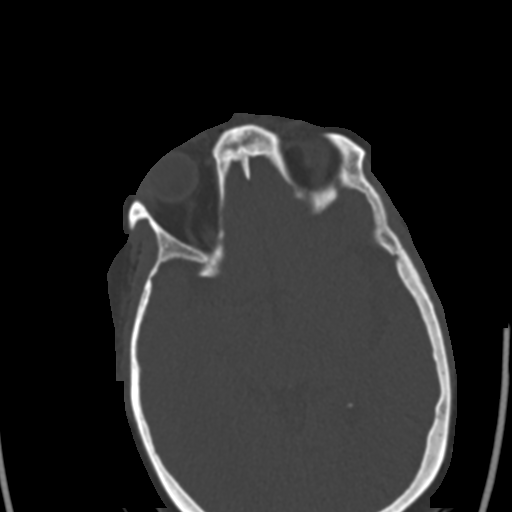
[im 79/85  brain]
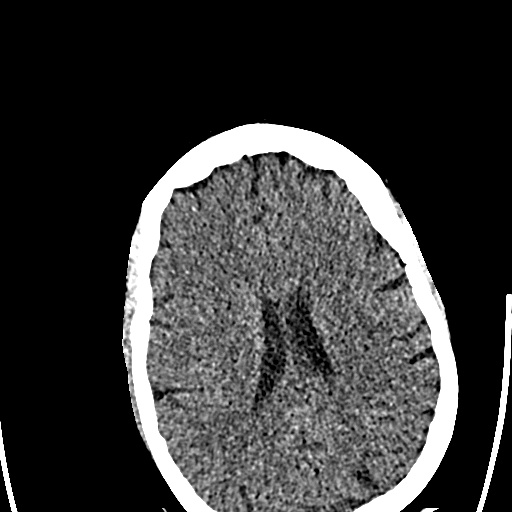
[im 79/85  bone]
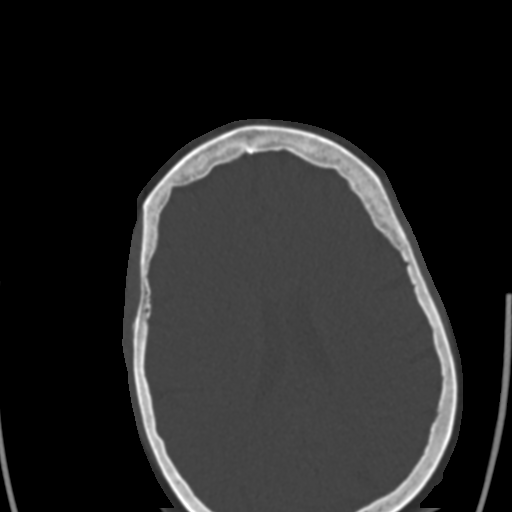

[Series 6: st cor · coronal · 0.35mm/px · 3 of 95 slices shown]
[im 24/95  bone]
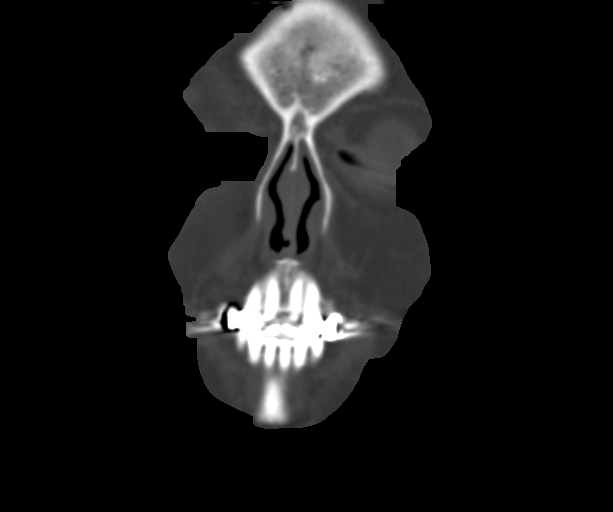
[im 48/95  bone]
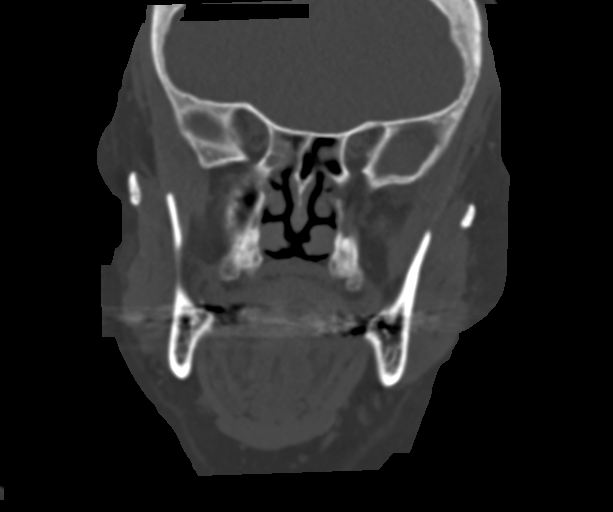
[im 71/95  bone]
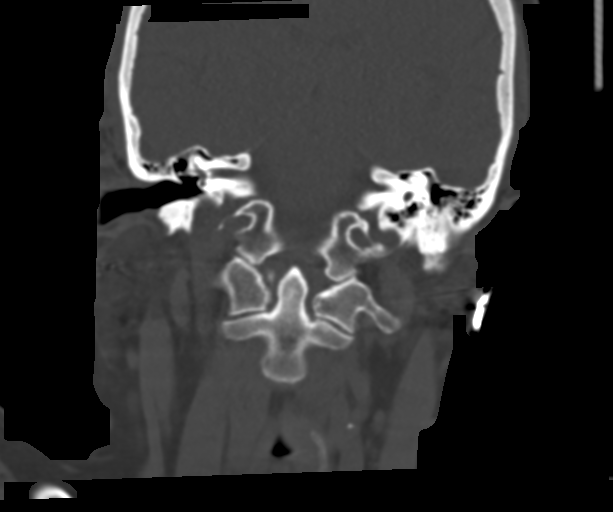

[Series 9: bone sag · sagittal · 0.35mm/px · 3 of 116 slices shown]
[im 39/116  bone]
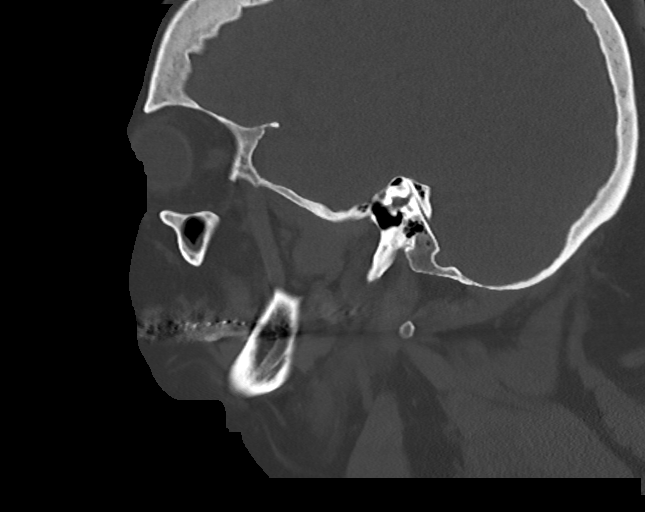
[im 58/116  bone]
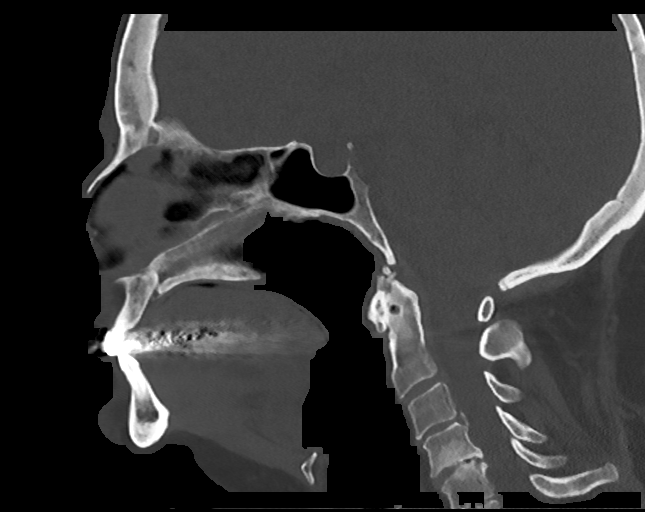
[im 77/116  bone]
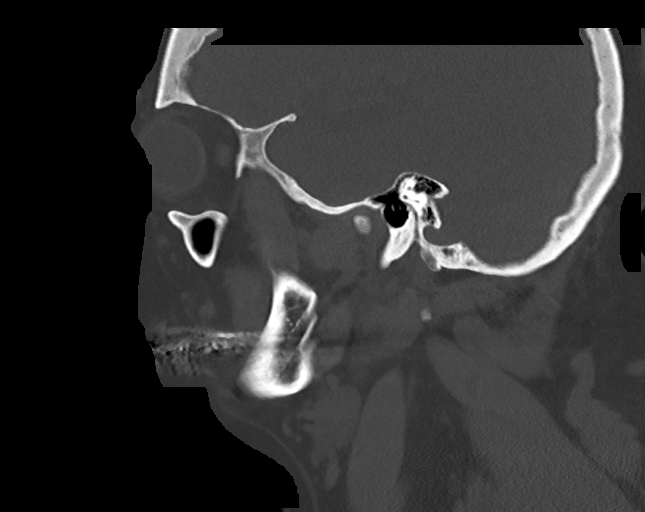

[15 of 47 positions shown; findings below may reference images not displayed]

FINDINGS: Osseous: Lower frontal bones are intact and normally aligned. No
displaced nasal bone fracture. Osseous structures about the orbits
appear intact and normally aligned bilaterally. Walls of the
maxillary sinuses appear intact and normally aligned. Bilateral
zygomatic arches and pterygoid plates are intact. No mandible
fracture or displacement seen.

Orbits: Orbital globes appear normal in position and configuration.
No retro-orbital hemorrhage or edema.

Sinuses: Mucosal thickening/fluid within the LEFT maxillary sinus,
ethmoid air cells and RIGHT sphenoid sinus, of uncertain chronicity.

Soft tissues: Prominent soft tissue defect/laceration overlying the
lower frontal bones. No underlying fracture.

Limited intracranial: No significant or unexpected finding.
IMPRESSION: 1. Prominent soft tissue defect/laceration overlying the lower
frontal bones. No underlying fracture.
2. No facial bone fracture or dislocation seen.
3. Fairly extensive paranasal sinus disease, of uncertain
chronicity.

## 2022-06-21 IMAGING — CT CT HEAD W/O CM
4 series · 16 of 47 positions shown, 18 images · non-contrast
Comparison: None.

CLINICAL DATA: Head trauma

EXAM:
CT HEAD WITHOUT CONTRAST
TECHNIQUE: Contiguous axial images were obtained from the base of the skull
through the vertex without intravenous contrast.

[Series 3: head wo · axial · 0.47mm/px · z∈[-147,-17]mm · 7 of 36 slices shown, 9 images]
[im 5/36  brain]
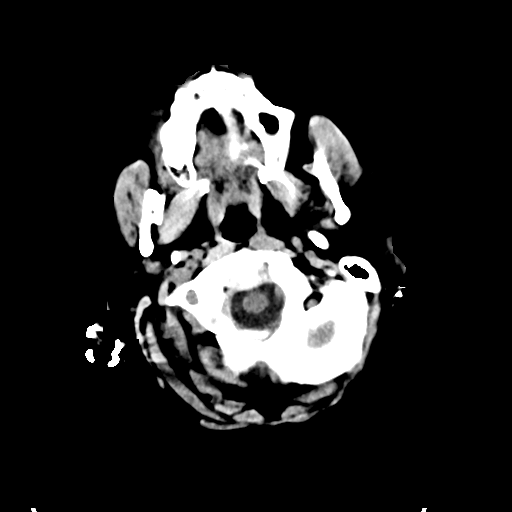
[im 5/36  bone]
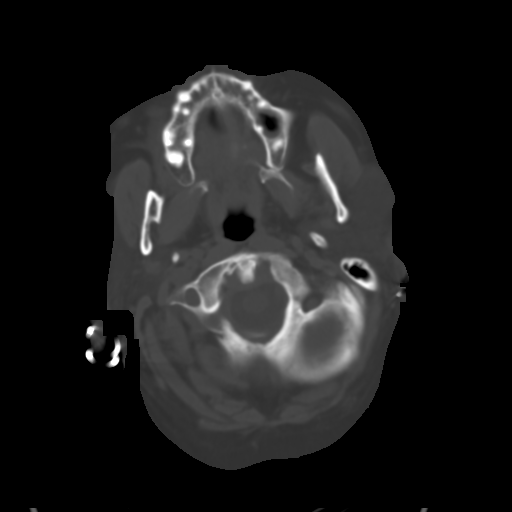
[im 9/36  brain]
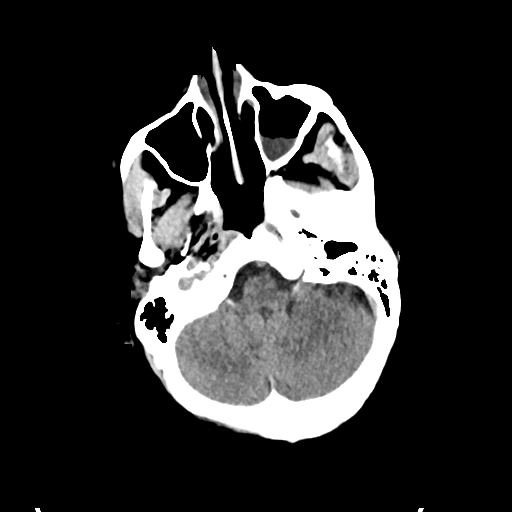
[im 14/36  brain]
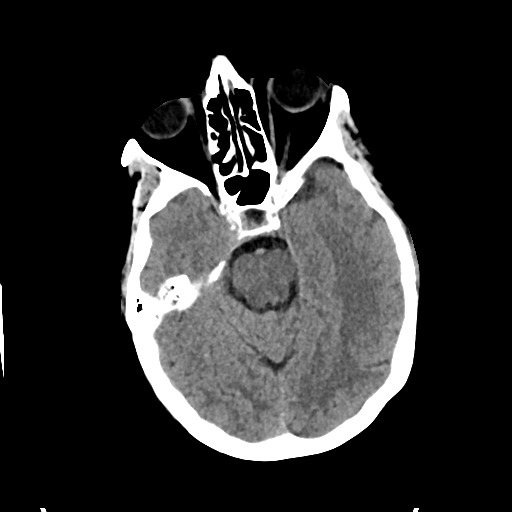
[im 18/36  brain]
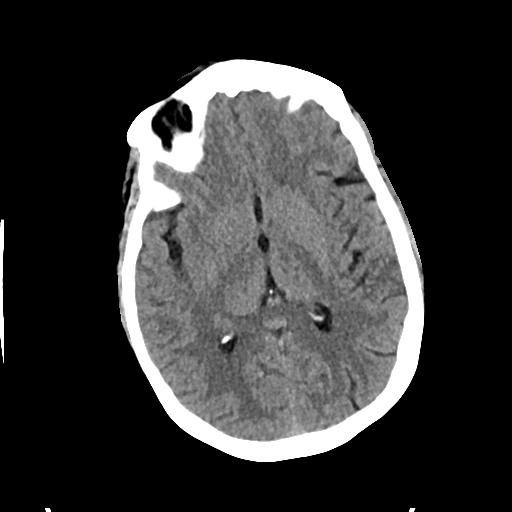
[im 22/36  brain]
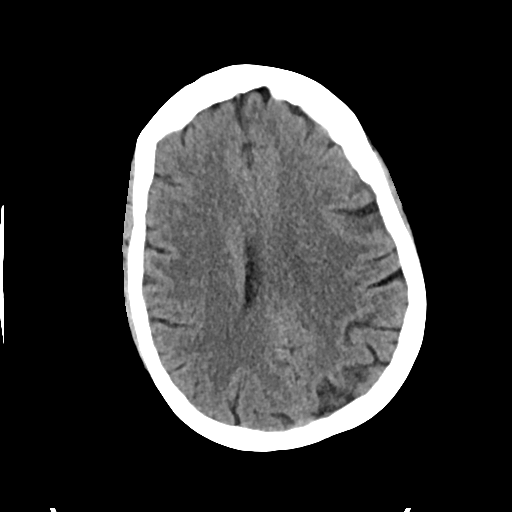
[im 22/36  bone]
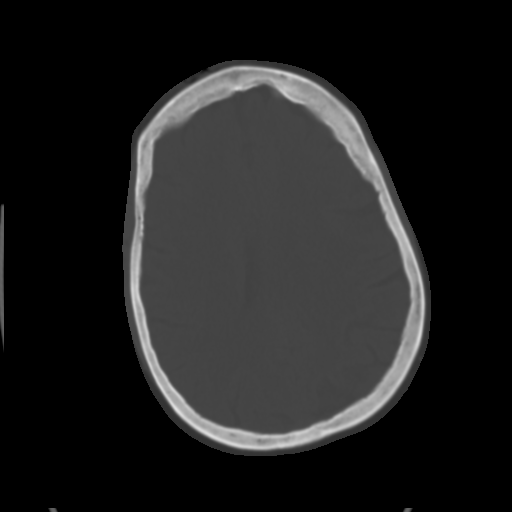
[im 27/36  brain]
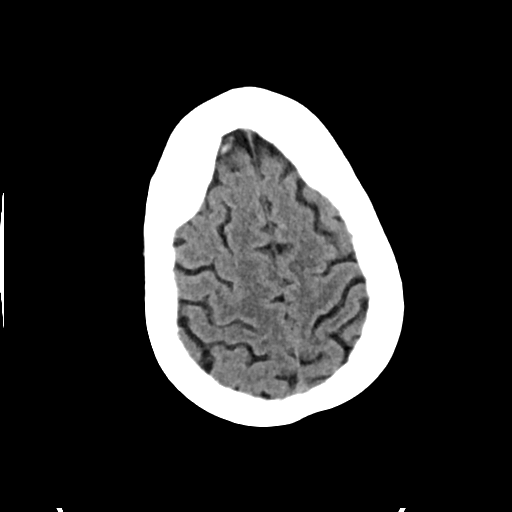
[im 31/36  brain]
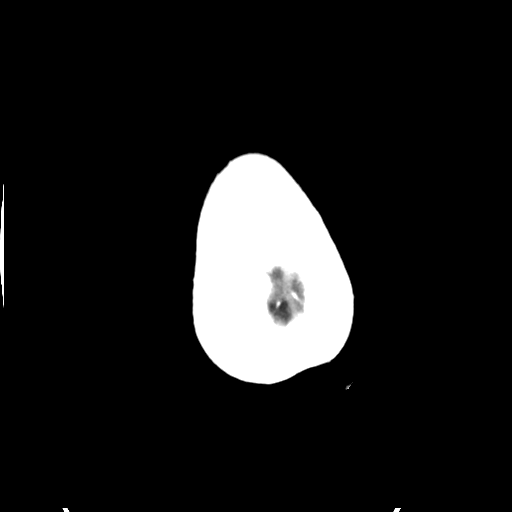

[Series 4: head bone · axial · 0.47mm/px · z∈[-151,-115]mm · 3 of 90 slices shown]
[im 9/90  bone]
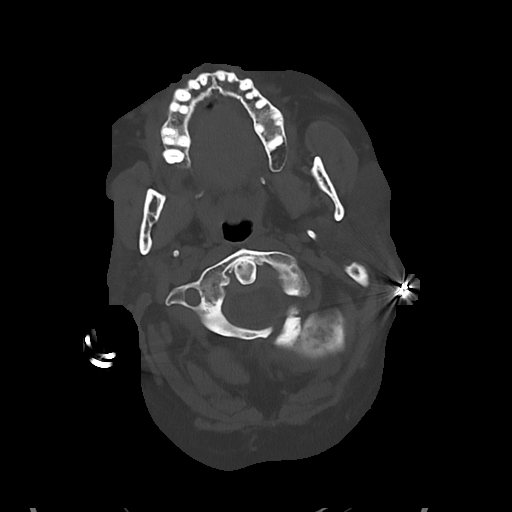
[im 18/90  bone]
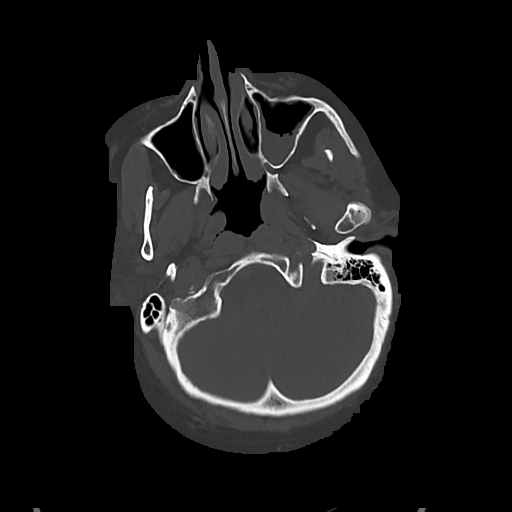
[im 27/90  bone]
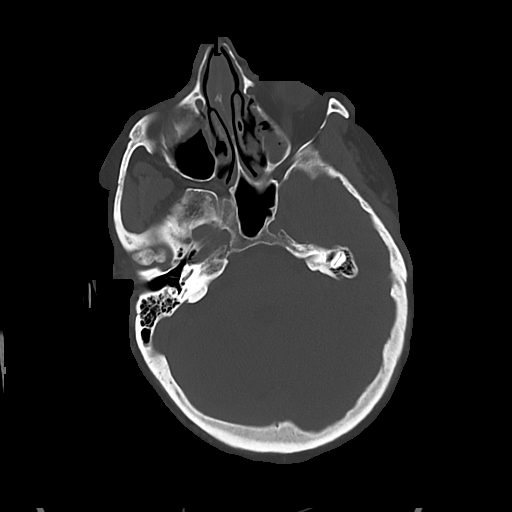

[Series 5: cor soft · coronal · 0.36mm/px · 3 of 77 slices shown]
[im 26/77  brain]
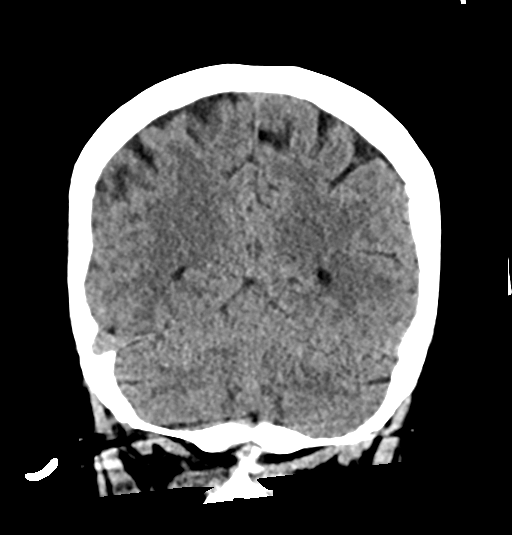
[im 34/77  brain]
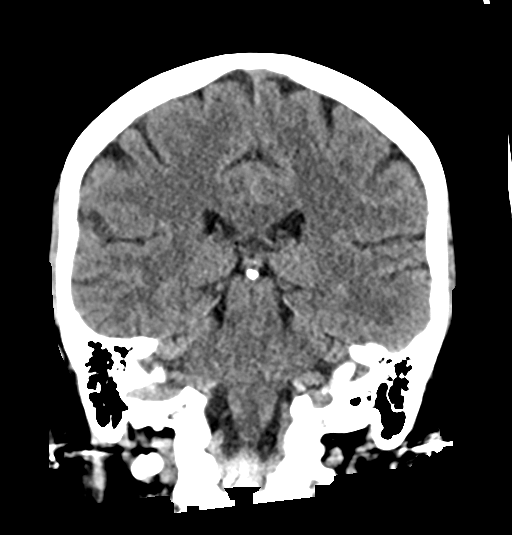
[im 43/77  brain]
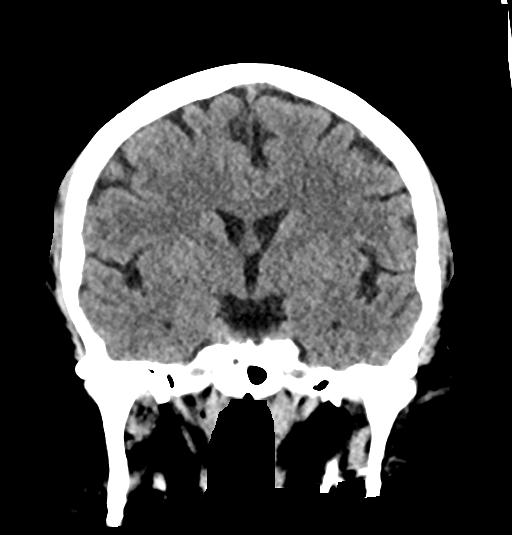

[Series 6: sag soft · sagittal · 0.37mm/px · 3 of 61 slices shown]
[im 21/61  brain]
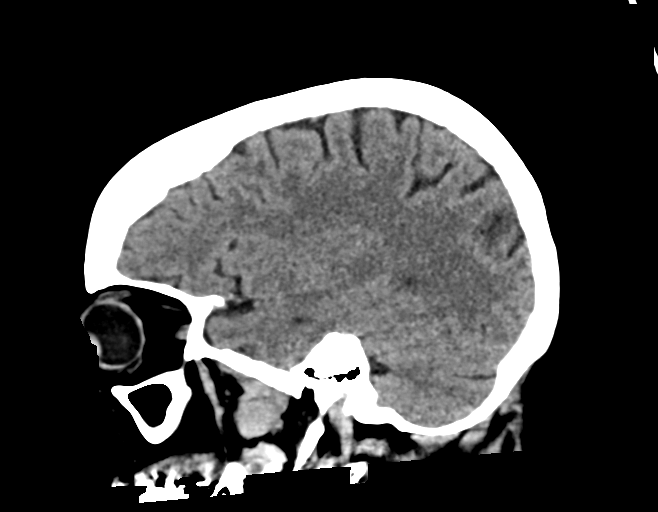
[im 31/61  brain]
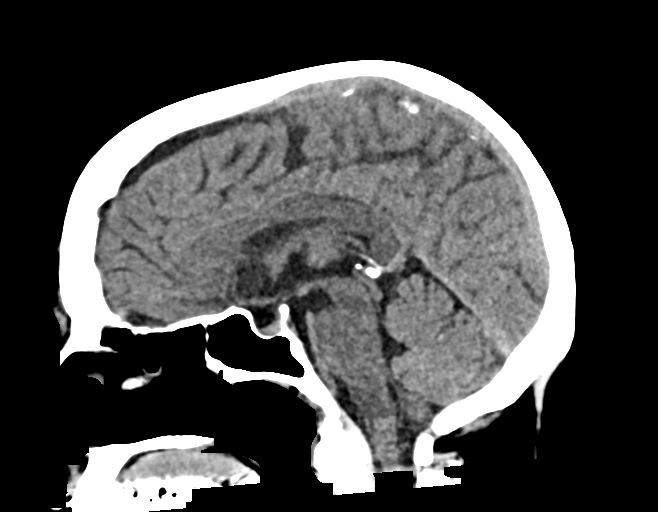
[im 40/61  brain]
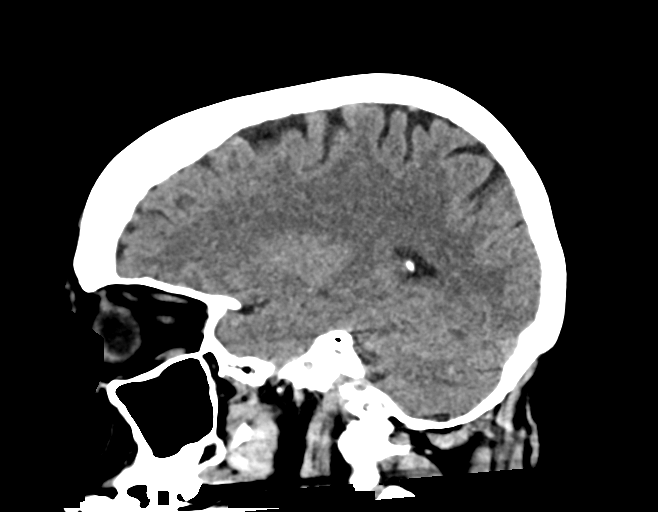

[16 of 47 positions shown; findings below may reference images not displayed]

FINDINGS: Brain: Ventricles are normal in size and configuration. There is no
mass, hemorrhage, edema or other evidence of acute parenchymal
abnormality. No extra-axial hemorrhage.

Vascular: Chronic calcified atherosclerotic changes of the large
vessels at the skull base. No unexpected hyperdense vessel.

Skull: No skull fracture or displacement.

Sinuses/Orbits: Orbital and retro-orbital soft tissues are
unremarkable.

Other: Soft tissue defect/laceration overlying the frontal bones. No
underlying fracture.
IMPRESSION: 1. Soft tissue defect/laceration overlying the frontal bones. No
underlying fracture.
2. No acute intracranial abnormality. No intracranial mass,
hemorrhage or edema.

## 2022-06-21 IMAGING — CT CT CERVICAL SPINE W/O CM
4 series · 16 of 33 positions shown, 19 images · non-contrast
Comparison: None.

CLINICAL DATA: Neck trauma

EXAM:
CT CERVICAL SPINE WITHOUT CONTRAST
TECHNIQUE: Multidetector CT imaging of the cervical spine was performed without
intravenous contrast. Multiplanar CT image reconstructions were also
generated.

[Series 5: c spine soft · axial · 0.31mm/px · z∈[-252,-196]mm · 3 of 85 slices shown]
[im 15/85  soft-tissue]
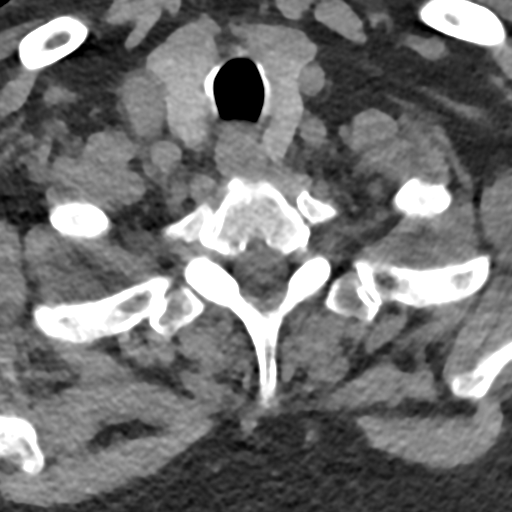
[im 29/85  soft-tissue]
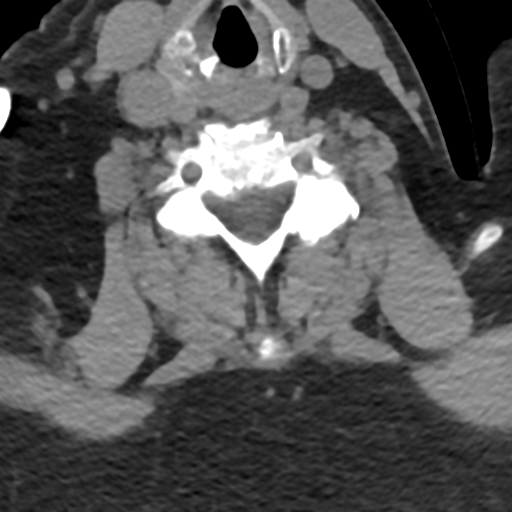
[im 43/85  soft-tissue]
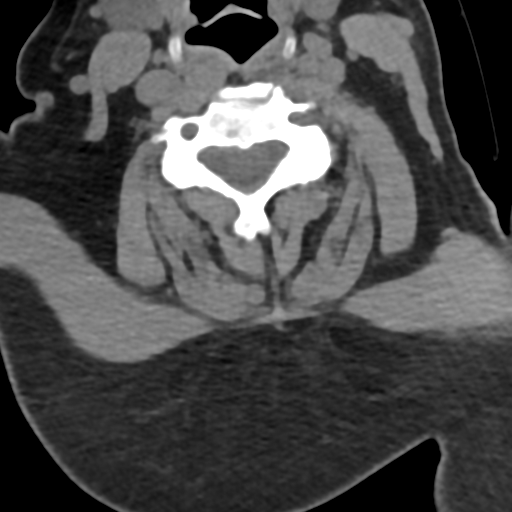

[Series 8: sag bone · sagittal · 0.33mm/px · 5 of 94 slices shown, 6 images]
[im 32/94  bone]
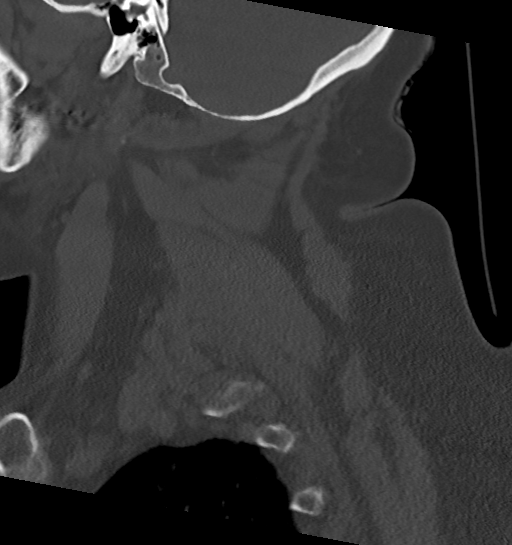
[im 39/94  bone]
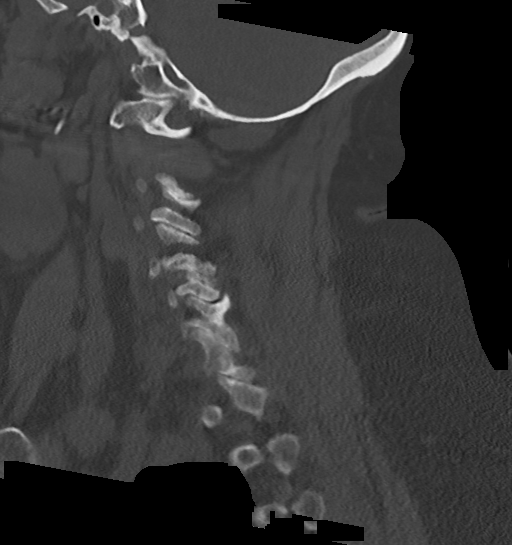
[im 47/94  soft-tissue]
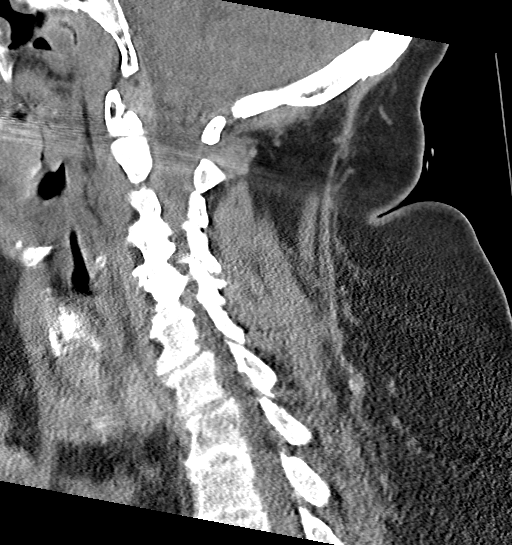
[im 47/94  bone]
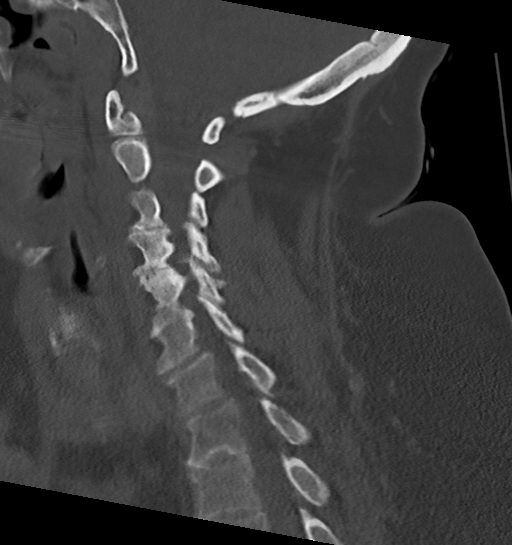
[im 55/94  bone]
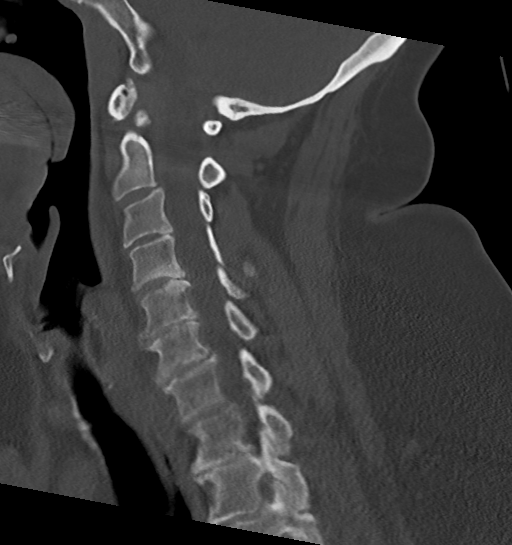
[im 63/94  bone]
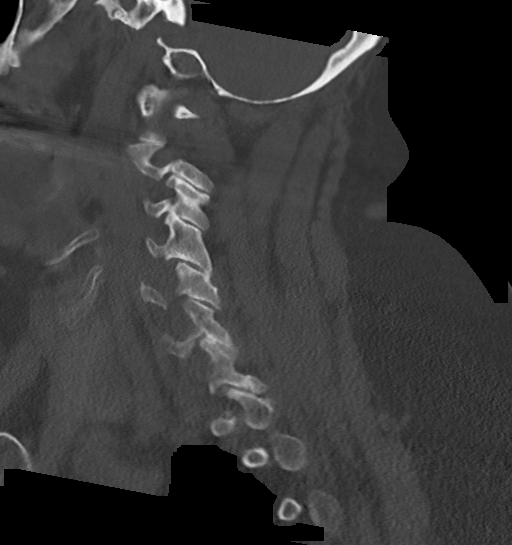

[Series 9: cor bone · coronal · 0.35mm/px · 3 of 80 slices shown]
[im 16/80  bone]
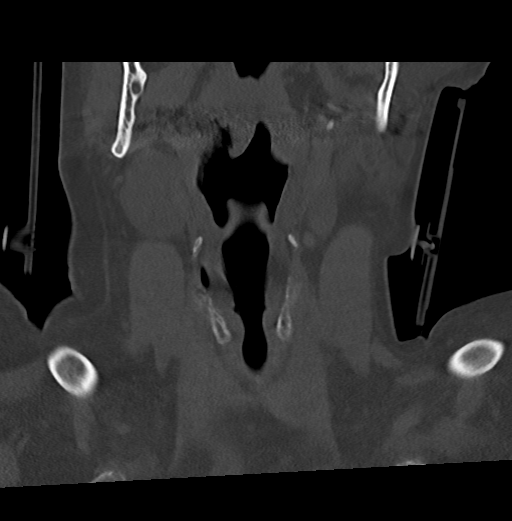
[im 32/80  bone]
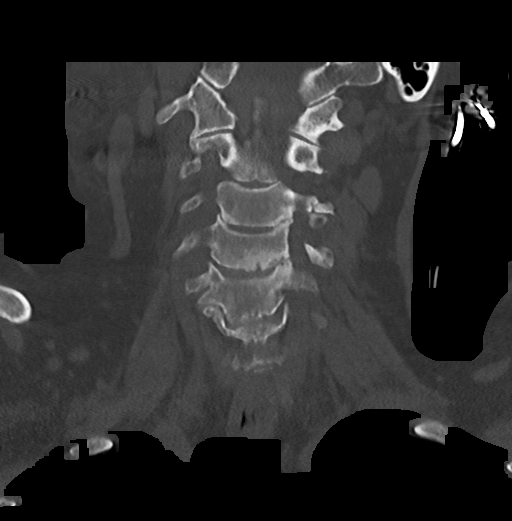
[im 48/80  bone]
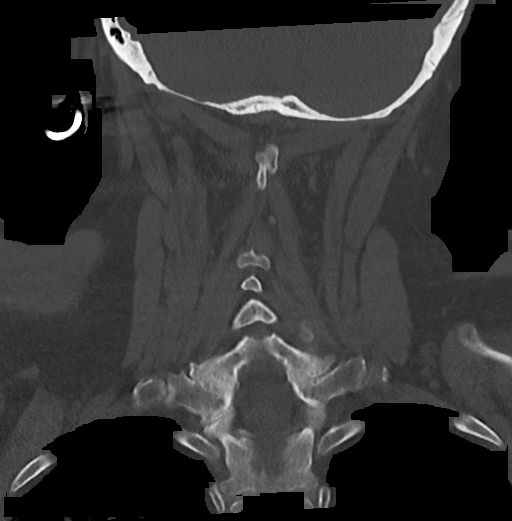

[Series 10: orthogonal axials · axial · 0.21mm/px · z∈[-271,-169]mm · 5 of 86 slices shown, 7 images]
[im 15/86  soft-tissue]
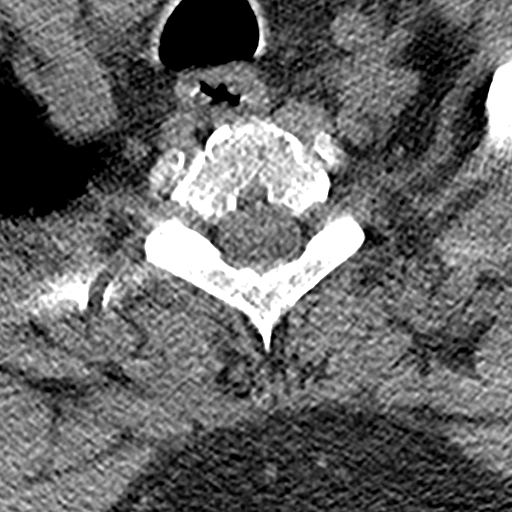
[im 15/86  bone]
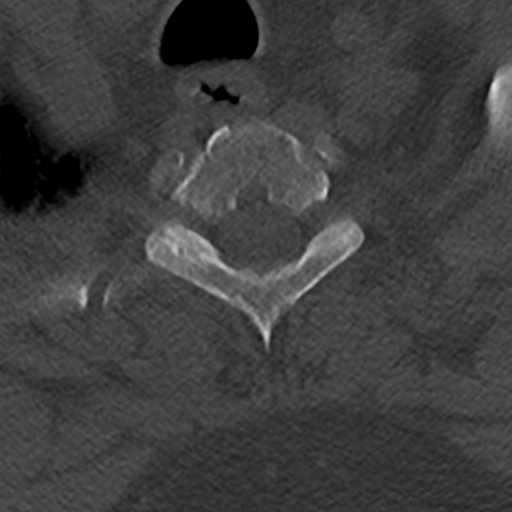
[im 29/86  bone]
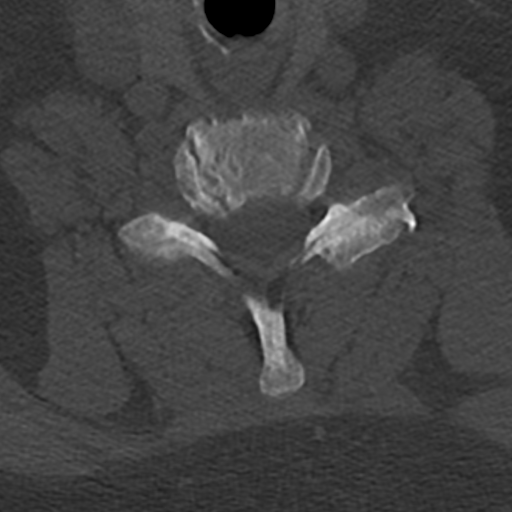
[im 43/86  bone]
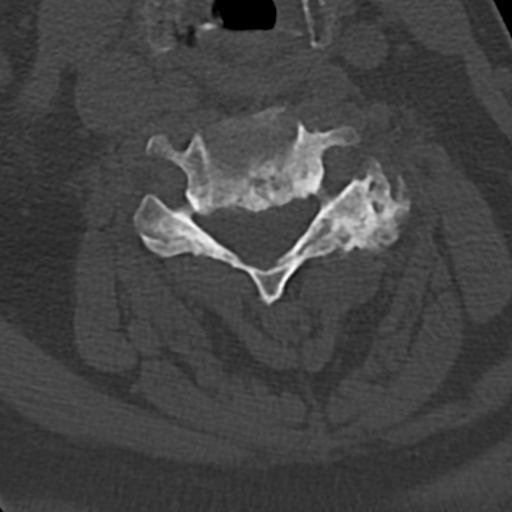
[im 57/86  bone]
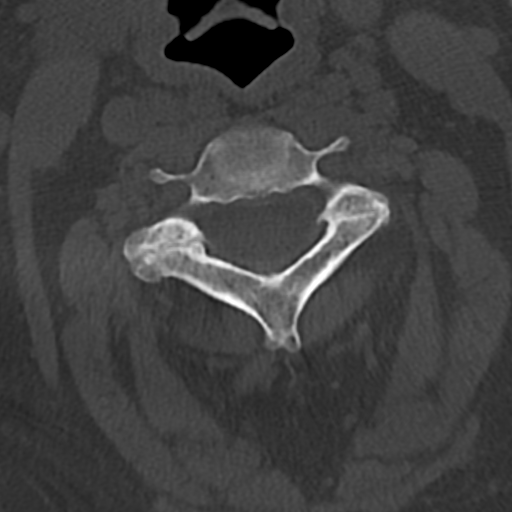
[im 71/86  soft-tissue]
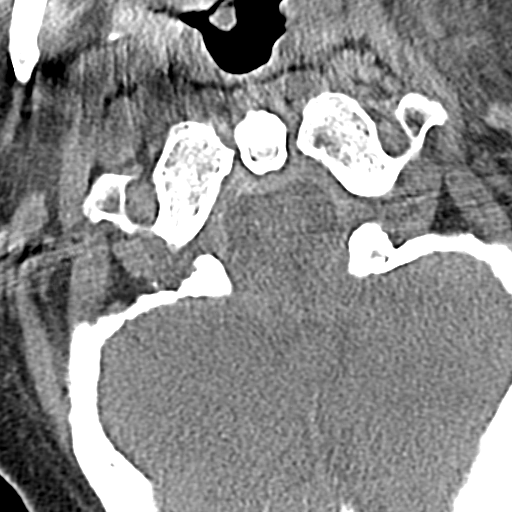
[im 71/86  bone]
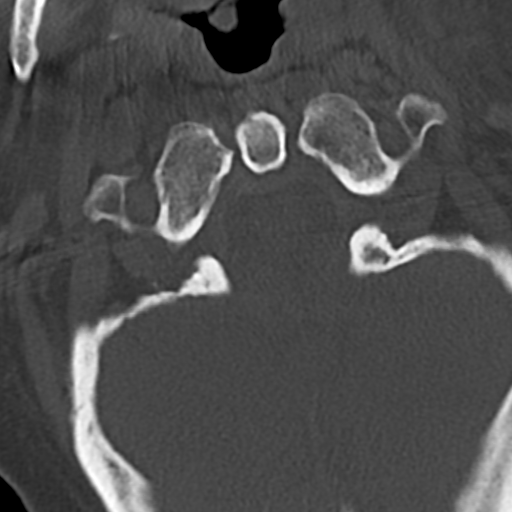

[16 of 33 positions shown; findings below may reference images not displayed]

FINDINGS: Alignment: Mild scoliosis. No evidence of acute vertebral body
subluxation.

Skull base and vertebrae: No fracture line or displaced fracture
fragment is seen.

Soft tissues and spinal canal: No prevertebral fluid or swelling. No
visible canal hematoma.

Disc levels: Degenerative changes involving the disc spaces from
C3-4 through C7-T1, mild to moderate in degree with associated disc
space narrowings and osseous spurring. Associated disc-osteophytic
bulges at the C3-4 and C4-5 levels causing moderate central canal
stenoses.

Additional degenerative hypertrophy of the uncovertebral and facet
joints causing moderate to severe LEFT neural foramen stenoses at
the C3-4 and C4-5 levels with possible associated nerve root
impingement.

Upper chest: No acute findings. Mild scarring/fibrosis in the lung
apices.

Other: Bilateral carotid atherosclerosis.
IMPRESSION: 1. No acute findings. No fracture or acute subluxation within the
cervical spine.
2. Degenerative changes of the cervical spine, as detailed above.
3. Bilateral carotid atherosclerosis.
